# Patient Record
Sex: Female | Born: 1985 | Race: White | Hispanic: No | Marital: Single | Smoking: Current every day smoker
Health system: Southern US, Community
[De-identification: ages and names within clinical notes are randomized; demographics above are authoritative.]

## PROBLEM LIST (undated history)

## (undated) DIAGNOSIS — F32A Depression, unspecified: Secondary | ICD-10-CM

## (undated) DIAGNOSIS — T7840XA Allergy, unspecified, initial encounter: Secondary | ICD-10-CM

## (undated) DIAGNOSIS — D649 Anemia, unspecified: Secondary | ICD-10-CM

## (undated) DIAGNOSIS — J45909 Unspecified asthma, uncomplicated: Secondary | ICD-10-CM

## (undated) DIAGNOSIS — N200 Calculus of kidney: Secondary | ICD-10-CM

## (undated) DIAGNOSIS — N870 Mild cervical dysplasia: Secondary | ICD-10-CM

## (undated) DIAGNOSIS — M797 Fibromyalgia: Secondary | ICD-10-CM

## (undated) DIAGNOSIS — F329 Major depressive disorder, single episode, unspecified: Secondary | ICD-10-CM

## (undated) HISTORY — PX: OTHER SURGICAL HISTORY: SHX169

## (undated) HISTORY — DX: Calculus of kidney: N20.0

## (undated) HISTORY — DX: Allergy, unspecified, initial encounter: T78.40XA

## (undated) HISTORY — PX: WISDOM TOOTH EXTRACTION: SHX21

## (undated) HISTORY — DX: Depression, unspecified: F32.A

## (undated) HISTORY — DX: Unspecified asthma, uncomplicated: J45.909

## (undated) HISTORY — DX: Major depressive disorder, single episode, unspecified: F32.9

## (undated) HISTORY — DX: Anemia, unspecified: D64.9

## (undated) HISTORY — DX: Fibromyalgia: M79.7

## (undated) HISTORY — PX: CYSTOSCOPY: SUR368

## (undated) HISTORY — DX: Mild cervical dysplasia: N87.0

---

## 2012-02-05 ENCOUNTER — Ambulatory Visit (INDEPENDENT_AMBULATORY_CARE_PROVIDER_SITE_OTHER): Payer: BC Managed Care – PPO | Admitting: Family Medicine

## 2012-02-05 DIAGNOSIS — M545 Low back pain, unspecified: Secondary | ICD-10-CM

## 2012-02-05 DIAGNOSIS — R944 Abnormal results of kidney function studies: Secondary | ICD-10-CM

## 2012-02-05 DIAGNOSIS — R109 Unspecified abdominal pain: Secondary | ICD-10-CM

## 2012-02-05 DIAGNOSIS — N23 Unspecified renal colic: Secondary | ICD-10-CM

## 2012-02-05 DIAGNOSIS — R6889 Other general symptoms and signs: Secondary | ICD-10-CM

## 2012-02-05 DIAGNOSIS — N200 Calculus of kidney: Secondary | ICD-10-CM | POA: Insufficient documentation

## 2012-02-05 LAB — POCT URINALYSIS DIPSTICK
Blood, UA: NEGATIVE
Glucose, UA: NEGATIVE
Nitrite, UA: NEGATIVE
Protein, UA: NEGATIVE
Urobilinogen, UA: 0.2

## 2012-02-05 LAB — POCT UA - MICROSCOPIC ONLY
Casts, Ur, LPF, POC: NEGATIVE
Crystals, Ur, HPF, POC: NEGATIVE
Epithelial cells, urine per micros: NEGATIVE
RBC, urine, microscopic: NEGATIVE
Yeast, UA: NEGATIVE

## 2012-02-05 MED ORDER — HYDROCODONE-ACETAMINOPHEN 5-500 MG PO TABS: 1.0000 | ORAL_TABLET | Freq: Three times a day (TID) | ORAL | Status: AC | PRN

## 2012-02-05 MED ORDER — NAPROXEN 500 MG PO TABS: 500.0000 mg | ORAL_TABLET | Freq: Two times a day (BID) | ORAL | Status: DC

## 2012-02-05 NOTE — Progress Notes (Signed)
[facility]:  Office Visit  Chief Complaint:  Chief Complaint  Patient presents with  . Back Pain    low back; h/o of kidney stones; pain started fri night    HPI: Dawn Dunn is a 26 y.o. female who complains of  3 day history of right back pain, nausea. She has a hx of kidney stones ( uric acid) and this feels like it. No UTI sxs. LMP 01/13/2012.  Past Medical History  Diagnosis Date  . Kidney stones   . Anemia   . Fibromyalgia   . Mild cervical dysplasia    Past Surgical History  Procedure Date  . Right ankle fracture    History   Social History  . Marital Status: Single    Spouse Name: N/A    Number of Children: N/A  . Years of Education: N/A   Social History Main Topics  . Smoking status: Current Everyday Smoker -- 1.0 packs/day for 8 years  . Smokeless tobacco: None  . Alcohol Use: No  . Drug Use: No  . Sexually Active: None   Other Topics Concern  . None   Social History Narrative  . None   Family History  Problem Relation Age of Onset  . Diabetes Father   . Depression Father   . Hyperlipidemia Father    Allergies  Allergen Reactions  . Toradol Other (See Comments)    Pain at the injection site  . Lortab Rash    Only allergic to [employer]; Hydrocodone tablets are ok   Prior to Admission medications   Medication Sig Start Date End Date Taking? Authorizing Provider  fluticasone (FLONASE) 50 MCG/ACT nasal spray Place 2 sprays into the nose daily.   Yes Historical Provider, MD     ROS: The patient denies fevers, chills, night sweats, unintentional weight loss, chest pain, palpitations, wheezing, dyspnea on exertion, vomiting, abdominal pain, dysuria, hematuria, melena, numbness, weakness, or tingling.+ back ache  All other systems have been reviewed and were otherwise negative with the exception of those mentioned in the HPI and as above.    PHYSICAL EXAM: Filed Vitals:   02/05/12 1945  BP: 115/71  Pulse: 102    Temp: 98.1 F (36.7 C)  Resp: 16   Filed Vitals:   02/05/12 1945  Height: 5\' 4"  (1.626 m)  Weight: 179 lb (81.194 kg)   Body mass index is 30.73 kg/(m^2).  General: Alert, no acute distress HEENT:  Normocephalic, atraumatic, oropharynx patent. Cardiovascular:  Regular rate and rhythm, no rubs murmurs or gallops.  No Carotid bruits, radial pulse intact. No pedal edema.  Respiratory: Clear to auscultation bilaterally.  No wheezes, rales, or rhonchi.  No cyanosis, no use of accessory musculature GI: No organomegaly, abdomen is soft and non-tender, positive bowel sounds.  No masses. Skin: No rashes. Neurologic: Facial musculature symmetric. Psychiatric: Patient is appropriate throughout our interaction. Lymphatic: No axillary or cervical lymphadenopathy Musculoskeletal: Gait intact.+Right flank tenderness   LABS: Results for orders placed in visit on 02/05/12  POCT URINALYSIS DIPSTICK      Component Value Range   Color, UA yellow     Clarity, UA clear     Glucose, UA neg     Bilirubin, UA neg     Ketones, UA neg     Spec Grav, UA 1.020     Blood, UA neg     pH, UA 6.0     Protein, UA neg     Urobilinogen, UA 0.2  Nitrite, UA neg     Leukocytes, UA Trace    POCT UA - MICROSCOPIC ONLY      Component Value Range   WBC, Ur, HPF, POC 0-3     RBC, urine, microscopic neg     Bacteria, U Microscopic trace     Mucus, UA neg     Epithelial cells, urine per micros neg     Crystals, Ur, HPF, POC neg     Casts, Ur, LPF, POC neg     Yeast, UA neg       ASSESSMENT/PLAN: Encounter Diagnosis  Name Primary?  . Low back pain   1. Right low back pain-most likely renal stone vs UTI. Patient feels like this is similar to her renal colic. Will cx urine before treating. For now will treat as if renal stone.  Rx Vicodin and Naproxen.  If no improvement or pain worsen then call and will get CT scan, refer to urology.     Hamilton Capri PHUONG, DO 02/05/2012 8:05 PM

## 2012-02-07 LAB — URINE CULTURE
Colony Count: NO GROWTH
Organism ID, Bacteria: NO GROWTH

## 2012-10-23 ENCOUNTER — Ambulatory Visit (INDEPENDENT_AMBULATORY_CARE_PROVIDER_SITE_OTHER): Payer: BC Managed Care – PPO | Admitting: Family Medicine

## 2012-10-23 ENCOUNTER — Ambulatory Visit: Payer: BC Managed Care – PPO

## 2012-10-23 VITALS — BP 111/75 | HR 79 | Temp 98.6°F | Resp 16 | Ht 64.5 in | Wt 181.0 lb

## 2012-10-23 DIAGNOSIS — R05 Cough: Secondary | ICD-10-CM

## 2012-10-23 DIAGNOSIS — J40 Bronchitis, not specified as acute or chronic: Secondary | ICD-10-CM

## 2012-10-23 DIAGNOSIS — R062 Wheezing: Secondary | ICD-10-CM

## 2012-10-23 DIAGNOSIS — J9801 Acute bronchospasm: Secondary | ICD-10-CM

## 2012-10-23 LAB — POCT CBC
Granulocyte percent: 61.6 %G (ref 37–80)
HCT, POC: 45 % (ref 37.7–47.9)
Hemoglobin: 13.7 g/dL (ref 12.2–16.2)
MCV: 97.5 fL — AB (ref 80–97)
POC Granulocyte: 7.6 — AB (ref 2–6.9)
POC LYMPH PERCENT: 33.8 %L (ref 10–50)
RBC: 4.62 M/uL (ref 4.04–5.48)

## 2012-10-23 MED ORDER — ALBUTEROL SULFATE (2.5 MG/3ML) 0.083% IN NEBU
2.5000 mg | INHALATION_SOLUTION | Freq: Once | RESPIRATORY_TRACT | Status: AC
  Administered 2012-10-23: 2.5 mg via RESPIRATORY_TRACT

## 2012-10-23 MED ORDER — HYDROCODONE-ACETAMINOPHEN 5-325 MG PO TABS: 1.0000 | ORAL_TABLET | Freq: Four times a day (QID) | ORAL | Status: DC | PRN

## 2012-10-23 MED ORDER — IPRATROPIUM BROMIDE 0.02 % IN SOLN
0.5000 mg | Freq: Once | RESPIRATORY_TRACT | Status: AC
  Administered 2012-10-23: 0.5 mg via RESPIRATORY_TRACT

## 2012-10-23 MED ORDER — AZITHROMYCIN 500 MG PO TABS: 500.0000 mg | ORAL_TABLET | Freq: Every day | ORAL | Status: DC

## 2012-10-23 NOTE — Progress Notes (Signed)
Patient ID: Nanami Whitelaw MRN: [id number], DOB: 16-Jun-1986, 26 y.o. Date of Encounter: 10/23/2012, 6:17 PM  Primary Physician: No primary provider on file.  Chief Complaint:  Chief Complaint  Patient presents with  . URI    x 8 days    HPI: 26 y.o. year old female presents with an eight day history of nasal congestion, post nasal drip, sore throat, cough, and wheezing. No sinus pressure. Afebrile. No chills. Nasal congestion thick and green/yellow. Cough is productive of green/yellow sputum and worse in the morning. Normal hearing. Has been using her albuterol inhaler 2-3 times per day throughout illness to help with her wheezing and shortness of breath. Asthma typically well controlled at baseline, not requiring the use of any medications. Only needs albuterol when she is ill. No GI complaints. Appetite normal. She is concerned because her sister is having a baby on 10/28/12 and she wants to be able to be around the family at this time. She is up to date with her TDaP. Current everyday smoker.   No sick contacts, recent antibiotics, or recent travels.   No leg trauma, sedentary periods, or h/o cancer.  Past Medical History  Diagnosis Date  . Kidney stones   . Anemia   . Fibromyalgia   . Mild cervical dysplasia   . Asthma      Home Meds: Prior to Admission medications   Not on File    Allergies:  Allergies  Allergen Reactions  . Ketorolac Tromethamine Other (See Comments)    Pain at the injection site  . Hydrocodone-Acetaminophen Rash    Only allergic to [employer]; Hydrocodone tablets are ok    History   Social History  . Marital Status: Single    Spouse Name: N/A    Number of Children: N/A  . Years of Education: N/A   Occupational History  . Not on file.   Social History Main Topics  . Smoking status: Current Every Day Smoker -- 1.0 packs/day for 8 years  . Smokeless tobacco: Not on file  . Alcohol Use: No  . Drug Use: No  . Sexually Active: Not on  file   Other Topics Concern  . Not on file   Social History Narrative  . No narrative on file     Review of Systems: Constitutional: negative for chills, fever, night sweats or weight changes Cardiovascular: negative for chest pain or palpitations Respiratory: negative for hemoptysis or dyspnea  Abdominal: negative for abdominal pain, nausea, vomiting or diarrhea Dermatological: negative for rash Neurologic: negative for headache   Physical Exam: Blood pressure 111/75, pulse 79, temperature 98.6 F (37 C), resp. rate 16, height 5' 4.5" (1.638 m), weight 181 lb (82.101 kg), last menstrual period 10/15/2012, SpO2 97.00%., Body mass index is 30.59 kg/(m^2). General: Well developed, well nourished, in no acute distress. Head: Normocephalic, atraumatic, eyes without discharge, sclera non-icteric, nares are congested. Bilateral auditory canals clear, TM's are without perforation, pearly grey with reflective cone of light bilaterally. No sinus TTP. Oral cavity moist, dentition normal. Posterior pharynx with post nasal drip and mild erythema. No peritonsillar abscess or tonsillar exudate. Neck: Supple. No thyromegaly. Full ROM. No lymphadenopathy. Lungs: Coarse breath sounds bilaterally with bilateral wheezes. No rales or rhonchi. Breathing is unlabored. Status post albuterol and atrovent neb: patient feel better. Decreased wheezing to auscultation.  Heart: RRR with S1 S2. No murmurs, rubs, or gallops appreciated. Msk:  Strength and tone normal for age. Extremities: No clubbing or cyanosis. No edema. Neuro:  Alert and oriented X 3. Moves all extremities spontaneously. CNII-XII grossly in tact. Psych:  Responds to questions appropriately with a normal affect.   Labs: Results for orders placed in visit on 10/23/12  POCT CBC      Component Value Range   WBC 12.3 (*) 4.6 - 10.2 K/uL   Lymph, poc 4.2 (*) 0.6 - 3.4   POC LYMPH PERCENT 33.8  10 - 50 %L   MID (cbc) 0.6  0 - 0.9   POC MID % 4.6   0 - 12 %M   POC Granulocyte 7.6 (*) 2 - 6.9   Granulocyte percent 61.6  37 - 80 %G   RBC 4.62  4.04 - 5.48 M/uL   Hemoglobin 13.7  12.2 - 16.2 g/dL   HCT, POC 13.0  86.5 - 47.9 %   MCV 97.5 (*) 80 - 97 fL   MCH, POC 29.7  27 - 31.2 pg   MCHC 30.4 (*) 31.8 - 35.4 g/dL   RDW, POC 78.4     Platelet Count, POC 366  142 - 424 K/uL   MPV 10.6  0 - 99.8 fL   [facility] reading (PRIMARY) by  Dr. Milus Glazier. Enlarged perihilar lymph node. No infiltrate.   ASSESSMENT AND PLAN:  26 y.o. year old female with bronchitis, bronchospasm, cough, wheezing, and tobacco use.  -Azithromycin 500 mg 1 po daily #5 no RF -Norco 5/325 mg 1 po q 4-6 hours prn pain #30 no RF, SED, she tolerates pill form of this -Use albuterol inhaler, does not need a refill -At discharge patient states her breathing is much better  -Mucinex -Tylenol/Motrin prn -Rest/fluids -Recommend repeat CXR 6 months, my card was given to patient. She was advised to call in AM and schedule appointment for this follow up study. Discussed with patient.  -Lengthy discussion with patient about stopping smoking, she states that she is not ready to stop as she enjoys this. I have asked her to call me in one month with her progress on quitting tobacco use.  -RTC precautions -RTC 3-5 days if no improvement  Signed, Eula Listen, PA-C 10/23/2012 6:17 PM

## 2012-11-01 ENCOUNTER — Telehealth: Payer: Self-pay

## 2012-11-01 NOTE — Telephone Encounter (Signed)
Was here for bronchitis, 10/23/12 now wants to try to quit smoking. Recommend repeat CXR 6 months, my card was given to patient. She was advised to call in AM and schedule appointment for this follow up study. Discussed with patient.  -Lengthy discussion with patient about stopping smoking, she states that she is not ready to stop as she enjoys this. I have asked her to call me in one month with her progress on quitting tobacco use.    Please advise

## 2012-11-01 NOTE — Telephone Encounter (Signed)
Pt would like to talk to [facility] about quitting smoking--needs advise  [phone]

## 2012-11-06 NOTE — Telephone Encounter (Signed)
Ryan, I'm not sure if you ever saw this message.

## 2012-11-06 NOTE — Telephone Encounter (Signed)
Encounter still open.

## 2012-11-07 MED ORDER — VARENICLINE TARTRATE 0.5 MG X 11 & 1 MG X 42 PO MISC: ORAL | Status: DC

## 2012-11-07 NOTE — Telephone Encounter (Signed)
Patient called to state that she is feeling guilty that her sister's new baby is having issues with their lungs having to be in the ICU at [facility] with chest tubes and O2. Patient would like to quit smoking now because of this. She states she still really enjoys smoking, but wants to quit. She does have a history of depression, but this is well controlled currently. She does state that she has a good support system at home right now to help her through this from both a smoking stand point and a depression stand point should it worsen. She has tried electronic cigarettes, nicotine gum and patches all without success. They all just make her want another cigarette. She already knows about the association with Chantix or Zyban and depression. She is however very open to initiating therapy with one of these medications today with close follow up. She knows depression warning signs and agrees to let a family know should she become depressed or call 911. She denies any HI or SI. She will follow up with me in one week, sooner if worse.

## 2012-11-14 ENCOUNTER — Telehealth: Payer: Self-pay

## 2012-11-14 NOTE — Telephone Encounter (Signed)
Spoke with patient. She is stressed that her sister's newborn is coming home on 11/15/12 and she is still smoking. She is down to 3-4 cigarettes per day. Her goal is to not smoke at all when she gets home from work today. Although she is scared because she still really enjoys it. She states that she is also stressed because her father kept her up the previous night by having her take him to the ER "for attention," and she yelled at him. Now she feels bad about that. She will try to find different coping mechanisms rather than smoking when she is stressed. She is to call back in one week with an update, sooner if she is worse. I have advised her that she should be proud of her accomplishments so far.

## 2012-11-14 NOTE — Telephone Encounter (Signed)
PATIENT CAME BY TO SPEAK TO RYAN DUNN ON HER BREAK BUT HE WAS IN A PATIENTS ROOM. PATIENT HAD TO LEAVE QUICKLY, BUT WILL TRY TO COME TOMORROW DURING HER LUNCH BREAK. SHE JUST WANTED TO LET HIM KNOW SHE CAME BY.

## 2012-11-28 ENCOUNTER — Telehealth: Payer: Self-pay

## 2012-11-28 NOTE — Telephone Encounter (Signed)
PT STATES SHE WAS SEEN BY RYAN AND HE WANTED HER TO TOUCH BASE WITH HIM. PLEASE CALL [phone] I DON'T SEE WHERE SHE HAD SEEN RYAN, BUT SHE DID SAY HIM

## 2012-11-29 NOTE — Telephone Encounter (Signed)
Noted. Please call the patient back to tell them I will give them a call sometime this weekend if it slows down, otherwise I will call first part of the week. Kudos on quitting smoking.

## 2012-11-29 NOTE — Telephone Encounter (Signed)
Dawn Dunn did see her on 10/23/12. Patient states she has quit smoking, and was calling to speak to Dawn Dunn about it, she states she is well, she was just calling to talk to [name] about it.

## 2012-11-30 NOTE — Telephone Encounter (Signed)
Called pt LMOM wit info from [place].

## 2012-12-20 ENCOUNTER — Encounter: Payer: Self-pay | Admitting: Family Medicine

## 2012-12-20 ENCOUNTER — Ambulatory Visit (INDEPENDENT_AMBULATORY_CARE_PROVIDER_SITE_OTHER): Payer: BC Managed Care – PPO | Admitting: Family Medicine

## 2012-12-20 VITALS — BP 112/74 | HR 78 | Temp 98.5°F | Resp 16 | Ht 64.0 in | Wt 188.0 lb

## 2012-12-20 DIAGNOSIS — Z0000 Encounter for general adult medical examination without abnormal findings: Secondary | ICD-10-CM

## 2012-12-20 DIAGNOSIS — Z23 Encounter for immunization: Secondary | ICD-10-CM

## 2012-12-20 LAB — CBC WITH DIFFERENTIAL/PLATELET
Basophils Absolute: 0 10*3/uL (ref 0.0–0.1)
HCT: 38.6 % (ref 36.0–46.0)
Hemoglobin: 12.9 g/dL (ref 12.0–15.0)
Lymphocytes Relative: 30 % (ref 12–46)
Monocytes Absolute: 0.4 10*3/uL (ref 0.1–1.0)
Monocytes Relative: 5 % (ref 3–12)
Neutro Abs: 4.8 10*3/uL (ref 1.7–7.7)
RDW: 12.7 % (ref 11.5–15.5)
WBC: 7.7 10*3/uL (ref 4.0–10.5)

## 2012-12-20 LAB — COMPREHENSIVE METABOLIC PANEL
AST: 15 U/L (ref 0–37)
Albumin: 4.4 g/dL (ref 3.5–5.2)
BUN: 10 mg/dL (ref 6–23)
Calcium: 9.1 mg/dL (ref 8.4–10.5)
Chloride: 105 mEq/L (ref 96–112)
Potassium: 4.4 mEq/L (ref 3.5–5.3)
Sodium: 139 mEq/L (ref 135–145)
Total Protein: 6.9 g/dL (ref 6.0–8.3)

## 2012-12-20 LAB — POCT UA - MICROSCOPIC ONLY

## 2012-12-20 LAB — POCT URINALYSIS DIPSTICK
Bilirubin, UA: NEGATIVE
Ketones, UA: NEGATIVE
Leukocytes, UA: NEGATIVE

## 2012-12-20 LAB — LIPID PANEL: HDL: 51 mg/dL (ref >39)

## 2012-12-20 LAB — TSH: TSH: 3.088 u[IU]/mL (ref 0.350–4.500)

## 2012-12-20 NOTE — Progress Notes (Addendum)
Subjective:    Patient ID: Dawn Dunn, female    DOB: [birth year], 26 y.o.   MRN: [id number] Chief Complaint  Patient presents with  . Annual Exam    HPI  Had abnml pap smear 2005 w/ several abnml colposcopies so had cryo in 2005 since then all have been normal. Has had yearly nml pap smears since then.   Has a h/o chronic back pain for over 10 yrs - told in the past that she had fibromyalgia from this.  Was told that her kidney stones were uric acid just by a physician looking at them but doesn't think that they have been sent off for lab analysis so concerned about taking calcium supplements. Not on mvi.  No current prob w/ kidney stones Stopped smoking!    Past Medical History  Diagnosis Date  . Kidney stones   . Anemia   . Fibromyalgia   . Mild cervical dysplasia   . Asthma    Past Surgical History  Procedure Date  . Right ankle fracture   . Wisdom tooth extraction   . Cystoscopy   . Cervical cyrosurgery    Family History  Problem Relation Age of Onset  . Diabetes Father   . Depression Father   . Hyperlipidemia Father    History   Social History  . Marital Status: Single    Spouse Name: N/A    Number of Children: N/A  . Years of Education: N/A   Occupational History  . Not on file.   Social History Main Topics  . Smoking status: Former Smoker -- 1.0 packs/day for 8 years    Quit date: 11/25/2012  . Smokeless tobacco: Never Used  . Alcohol Use: No  . Drug Use: No  . Sexually Active: Not on file   Other Topics Concern  . Not on file   Social History Narrative  . No narrative on file  Works at the [employer] so has had the rabies vaccine.  No current outpatient prescriptions on file prior to visit.   Allergies  Allergen Reactions  . Ketorolac Tromethamine Other (See Comments)    Pain at the injection site  . Hydrocodone-Acetaminophen Rash    Only allergic to [employer]; Hydrocodone tablets are ok      Review of Systems    Constitutional: Negative for fever, chills, diaphoresis, activity change, appetite change, fatigue and unexpected weight change.  HENT: Negative for hearing loss, ear pain, nosebleeds, congestion, sore throat, facial swelling, rhinorrhea, sneezing, drooling, mouth sores, trouble swallowing, neck pain, neck stiffness, dental problem, voice change, postnasal drip, sinus pressure, tinnitus and ear discharge.   Eyes: Negative for photophobia, pain, discharge, redness, itching and visual disturbance.  Respiratory: Negative for apnea, cough, choking, chest tightness, shortness of breath, wheezing and stridor.   Cardiovascular: Negative for chest pain, palpitations and leg swelling.  Gastrointestinal: Negative for nausea, vomiting, abdominal pain, diarrhea, constipation, blood in stool, abdominal distention, anal bleeding and rectal pain.  Genitourinary: Negative for dysuria, urgency, frequency, hematuria, flank pain, decreased urine volume, vaginal bleeding, vaginal discharge, enuresis, difficulty urinating, genital sores, vaginal pain, menstrual problem, pelvic pain and dyspareunia.  Musculoskeletal: Positive for myalgias and back pain. Negative for joint swelling, arthralgias and gait problem.  Skin: Negative for color change, pallor, rash and wound.  Neurological: Negative for dizziness, tremors, seizures, syncope, facial asymmetry, speech difficulty, weakness, light-headedness, numbness and headaches.  Hematological: Negative for adenopathy. Does not bruise/bleed easily.  Psychiatric/Behavioral: Negative for suicidal ideas, hallucinations, behavioral problems, confusion,  sleep disturbance, self-injury, dysphoric mood, decreased concentration and agitation. The patient is not nervous/anxious and is not hyperactive.       BP 112/74  Pulse 78  Temp 98.5 F (36.9 C) (Oral)  Resp 16  Ht 5\' 4"  (1.626 m)  Wt 188 lb (85.276 kg)  BMI 32.27 kg/m2  SpO2 100%  LMP 12/07/2012 Objective:   Physical Exam   Constitutional: She is oriented to person, place, and time. She appears well-developed and well-nourished. No distress.  HENT:  Head: Normocephalic and atraumatic.  Right Ear: Tympanic membrane, external ear and ear canal normal.  Left Ear: Tympanic membrane, external ear and ear canal normal.  Nose: Nose normal. No rhinorrhea.  Mouth/Throat: Uvula is midline, oropharynx is clear and moist and mucous membranes are normal. No posterior oropharyngeal erythema.  Eyes: Conjunctivae normal and EOM are normal. Pupils are equal, round, and reactive to light. Right eye exhibits no discharge. Left eye exhibits no discharge. No scleral icterus.  Neck: Normal range of motion. Neck supple. No thyromegaly present.  Cardiovascular: Normal rate, regular rhythm, normal heart sounds and intact distal pulses.   Pulmonary/Chest: Effort normal and breath sounds normal. No respiratory distress.  Abdominal: Soft. Bowel sounds are normal. There is no tenderness.  Genitourinary: Vagina normal and uterus normal. There is no rash or lesion on the right labia. There is no rash or lesion on the left labia. Cervix exhibits no motion tenderness and no friability. Right adnexum displays no mass and no tenderness. Left adnexum displays no mass and no tenderness.  Musculoskeletal: She exhibits no edema.  Lymphadenopathy:    She has no cervical adenopathy.  Neurological: She is alert and oriented to person, place, and time. She has normal reflexes.  Skin: Skin is warm and dry. She is not diaphoretic. No erythema.  Psychiatric: She has a normal mood and affect. Her behavior is normal.      Results for orders placed in visit on 12/20/12  POCT UA - MICROSCOPIC ONLY      Component Value Range   WBC, Ur, HPF, POC 0-2     RBC, urine, microscopic 0-3     Bacteria, U Microscopic TRACE     Mucus, UA SMALL     Epithelial cells, urine per micros 0-8     Crystals, Ur, HPF, POC NEG     Casts, Ur, LPF, POC NEG     Yeast, UA NEG     POCT URINALYSIS DIPSTICK      Component Value Range   Color, UA yellow     Clarity, UA clear     Glucose, UA neg     Bilirubin, UA neg     Ketones, UA neg     Spec Grav, UA 1.020     Blood, UA traCE     pH, UA 7.0     Protein, UA NEG     Urobilinogen, UA 0.2     Nitrite, UA NEG     Leukocytes, UA Negative      Assessment & Plan:   1. Annual physical exam  POCT UA - Microscopic Only, POCT urinalysis dipstick, CBC with Differential, Comprehensive metabolic panel, Lipid panel, TSH, Pap IG, CT/NG w/ reflex HPV when ASC-U, Vitamin D 25 hydroxy. If pap is negative, repeat in 3 yrs.  Ok to proceed w/ routine pap screening despite + history as abnormal pap and treatment was prior to 26 yo and all since 26 yo have been nml.  2. Need for prophylactic vaccination  and inoculation against influenza  Flu vaccine greater than or equal to 3yo preservative free IM  3. Back pain - pt would like some work limitations so that she stops exacerbating her back pain.  I advised her that she would need a full appt for complete eval of back pain before we could discuss this and she plans to sched. 4. Family planning - NEED TO DISCUSS AT F/U!!!!!!!!!

## 2012-12-21 ENCOUNTER — Encounter: Payer: Self-pay | Admitting: Family Medicine

## 2012-12-23 LAB — PAP IG, CT-NG, RFX HPV ASCU
Chlamydia Probe Amp: NEGATIVE
GC Probe Amp: NEGATIVE

## 2012-12-24 ENCOUNTER — Other Ambulatory Visit: Payer: Self-pay | Admitting: Family Medicine

## 2012-12-24 DIAGNOSIS — Z3009 Encounter for other general counseling and advice on contraception: Secondary | ICD-10-CM

## 2012-12-24 MED ORDER — NORGESTIM-ETH ESTRAD TRIPHASIC 0.18/0.215/0.25 MG-35 MCG PO TABS: 1.0000 | ORAL_TABLET | Freq: Every day | ORAL | Status: DC

## 2012-12-25 ENCOUNTER — Encounter: Payer: Self-pay | Admitting: Family Medicine

## 2013-01-17 ENCOUNTER — Ambulatory Visit: Payer: BC Managed Care – PPO

## 2013-01-17 ENCOUNTER — Ambulatory Visit (INDEPENDENT_AMBULATORY_CARE_PROVIDER_SITE_OTHER): Payer: BC Managed Care – PPO | Admitting: Family Medicine

## 2013-01-17 VITALS — BP 113/69 | HR 78 | Temp 98.4°F | Resp 16 | Ht 64.5 in | Wt 194.0 lb

## 2013-01-17 DIAGNOSIS — M546 Pain in thoracic spine: Secondary | ICD-10-CM

## 2013-01-17 DIAGNOSIS — M542 Cervicalgia: Secondary | ICD-10-CM

## 2013-01-17 DIAGNOSIS — M797 Fibromyalgia: Secondary | ICD-10-CM

## 2013-01-17 DIAGNOSIS — IMO0001 Reserved for inherently not codable concepts without codable children: Secondary | ICD-10-CM

## 2013-01-17 MED ORDER — AMITRIPTYLINE HCL 50 MG PO TABS: 25.0000 mg | ORAL_TABLET | Freq: Every day | ORAL | Status: DC

## 2013-01-17 MED ORDER — CYCLOBENZAPRINE HCL 10 MG PO TABS: 10.0000 mg | ORAL_TABLET | Freq: Three times a day (TID) | ORAL | Status: DC | PRN

## 2013-01-17 NOTE — Patient Instructions (Addendum)
   Fibromyalgia Fibromyalgia is a disorder that is often misunderstood. It is associated with muscular pains and tenderness that comes and goes. It is often associated with fatigue and sleep disturbances. Though it tends to be long-lasting, fibromyalgia is not life-threatening. CAUSES  The exact cause of fibromyalgia is unknown. People with certain gene types are predisposed to developing fibromyalgia and other conditions. Certain factors can play a role as triggers, such as:  Spine disorders.  Arthritis.  Severe injury (trauma) and other physical stressors.  Emotional stressors. SYMPTOMS   The main symptom is pain and stiffness in the muscles and joints, which can vary over time.  Sleep and fatigue problems. Other related symptoms may include:  Bowel and bladder problems.  Headaches.  Visual problems.  Problems with odors and noises.  Depression or mood changes.  Painful periods (dysmenorrhea).  Dryness of the skin or eyes. DIAGNOSIS  There are no specific tests for diagnosing fibromyalgia. Patients can be diagnosed accurately from the specific symptoms they have. The diagnosis is made by determining that nothing else is causing the problems. TREATMENT  There is no cure. Management includes medicines and an active, healthy lifestyle. The goal is to enhance physical fitness, decrease pain, and improve sleep. HOME CARE INSTRUCTIONS   Only take over-the-counter or prescription medicines as directed by your caregiver. Sleeping pills, tranquilizers, and pain medicines may make your problems worse.  Low-impact aerobic exercise is very important and advised for treatment. At first, it may seem to make pain worse. Gradually increasing your tolerance will overcome this feeling.  Learning relaxation techniques and how to control stress will help you. Biofeedback, visual imagery, hypnosis, muscle relaxation, yoga, and meditation are all options.  Anti-inflammatory medicines and  physical therapy may provide short-term help.  Acupuncture or massage treatments may help.  Take muscle relaxant medicines as suggested by your caregiver.  Avoid stressful situations.  Plan a healthy lifestyle. This includes your diet, sleep, rest, exercise, and friends.  Find and practice a hobby you enjoy.  Join a fibromyalgia support group for interaction, ideas, and sharing advice. This may be helpful. SEEK MEDICAL CARE IF:  You are not having good results or improvement from your treatment. FOR MORE INFORMATION  National Fibromyalgia Association: www.fmaware.org Arthritis Foundation: www.arthritis.org Document Released: 12/11/2005 Document Revised: 03/04/2012 Document Reviewed: 03/23/2010 [facility] Patient Information 2013 [place], [place].  Chronic Fatigue Syndrome Chronic Fatigue Syndrome is characterized by extreme fatigue that does not improve with rest. The cause of this condition is unknown.  SYMPTOMS  An unexplained dramatic loss of energy.  Muscle or joint soreness.  Severe weakness.  Frequent headaches.  Fever, sore throat, and swollen lymph glands.  Sleep problems.  Inability to concentrate. Symptoms must usually be present for over 6 months before the diagnosis of chronic fatigue can be made. There is no diagnostic test for this disease. Many other diseases can cause similar symptoms. A complete medical evaluation is needed to be sure you do not have other medical problems causing your symptoms. There is no specific treatment for Chronic Fatigue Syndrome. Cognitive behavioral therapy (similar to counseling) and/or a simple exercise regimen may be beneficial. Get plenty of rest and avoid alcohol and other depressant drugs. Call your caregiver for follow up care as recommended. Document Released: 01/18/2005 Document Revised: 03/04/2012 Document Reviewed: 03/12/2009 [facility] Patient Information 2013 [place], [place].

## 2013-01-17 NOTE — Progress Notes (Signed)
Subjective:    Patient ID: Dawn Dunn, female    DOB: 09-09-1986, 27 y.o.   MRN: [id number] Chief Complaint  Patient presents with  . chronic back pain    12 yrs    HPI  Dawn Dunn is a pleasant 27 yo woman who has had back pain for over 12 yrs.  Nothing happened at onset of pain to trigger but was diagnosed w/ depression the same yr. Had a lot of testing in [place] when she was a teen w/ blood work, Veterinary surgeon - perhaps MRI - this was about 8 yrs ago. Did PT when 27 yo.  She was referred to a pain specialist who and told she had fibromyalgia. Has been to several chiropractors regularly over the years, also tried acupunture - all helped briefly - for a few days and then back pain returned to baseline.  Has tried cymbalta 90mg  for depression but to expensive - helped pain a little but no other medications have helped at all although she has tried other pain medications.  Mood is mediocre now - has not been on mood med x 3 yrs as she lost her insurance. She is not sure if she has FM - the other day she moved her neck wrong and pulled a muscle and when she moves the wrong way, just folding laundry or standing for several hours will cause pain for days. Has told she has had some pinched nerves Has had a lot of CT scans for kidney stones. Seems like things are getting worse.   Not sleeping well at night - can't get comfortable - feels spastic and has to turn certain ways.  Pain is only in neck and shoulder and back - no pain in chest, arms, or legs Did break left foot and right ankle so painful in winter. Hands and elbows swell and get stiff but better w/ icy hot.  Celexa, PAXIL, effexor, zoloft, lexapro, wellbutrin, prozac - did not work for her. Most she was on from [age] yo.  Past Medical History  Diagnosis Date  . Kidney stones   . Anemia   . Fibromyalgia   . Mild cervical dysplasia   . Asthma    Current Outpatient Prescriptions on File Prior to Visit  Medication Sig Dispense Refill  .  Norgestimate-Ethinyl Estradiol Triphasic (TRI-SPRINTEC) 0.18/0.215/0.25 MG-35 MCG tablet Take 1 tablet by mouth daily.  1 Package  11  . amitriptyline (ELAVIL) 50 MG tablet Take 0.5 tablets (25 mg total) by mouth at bedtime.  30 tablet  1  . CHANTIX STARTING MONTH PAK 0.5 MG X 11 & 1 MG X 42 tablet        Allergies  Allergen Reactions  . Ketorolac Tromethamine Other (See Comments)    Pain at the injection site  . Hydrocodone-Acetaminophen Rash    Only allergic to [employer]; Hydrocodone tablets are ok    Review of Systems  Constitutional: Positive for activity change and fatigue. Negative for fever, chills and unexpected weight change.  Gastrointestinal: Negative for nausea, vomiting, abdominal pain, diarrhea and constipation.  Genitourinary: Negative for urgency, frequency, flank pain, decreased urine volume and difficulty urinating.  Musculoskeletal: Positive for myalgias, back pain, joint swelling and arthralgias. Negative for gait problem.  Neurological: Positive for weakness and headaches. Negative for syncope and numbness.  Hematological: Negative for adenopathy. Does not bruise/bleed easily.  Psychiatric/Behavioral: Positive for sleep disturbance, dysphoric mood and decreased concentration. Negative for suicidal ideas, hallucinations, confusion and self-injury. The patient is nervous/anxious.  BP 113/69  Pulse 78  Temp 98.4 F (36.9 C)  Resp 16  Ht 5' 4.5" (1.638 m)  Wt 194 lb (87.998 kg)  BMI 32.79 kg/m2  LMP 12/07/2012 Objective:   Physical Exam  Constitutional: She is oriented to person, place, and time. She appears well-developed and well-nourished.  HENT:  Head: Normocephalic.  Eyes: Conjunctivae normal are normal. No scleral icterus.  Neck: Normal range of motion. Neck supple.  Cardiovascular: Normal rate, regular rhythm and normal heart sounds.   Pulmonary/Chest: Effort normal and breath sounds normal. No respiratory distress.  Genitourinary: No breast  swelling, tenderness, discharge or bleeding.  Musculoskeletal: Normal range of motion. She exhibits no edema.       Right hip: She exhibits tenderness. She exhibits normal range of motion, normal strength and no bony tenderness.       Left hip: She exhibits tenderness. She exhibits normal range of motion, normal strength and no bony tenderness.       Cervical back: She exhibits tenderness, bony tenderness, pain and spasm. She exhibits normal range of motion, no swelling, no deformity and normal pulse.       Thoracic back: She exhibits tenderness, pain and spasm. She exhibits normal range of motion, no bony tenderness, no swelling and no deformity.       Lumbar back: She exhibits tenderness, pain and spasm. She exhibits normal range of motion, no bony tenderness and no deformity.       Tenderness over bilateral SI joints and diffusely over back No sig pain in all other "FM points" not located on her back.  Neurological: She is alert and oriented to person, place, and time. She has normal strength and normal reflexes. No sensory deficit. She exhibits normal muscle tone. Coordination and gait normal.  Reflex Scores:      Patellar reflexes are 2+ on the right side and 2+ on the left side.      Achilles reflexes are 2+ on the right side and 2+ on the left side. Skin: Skin is warm and dry. No erythema.  Psychiatric: She has a normal mood and affect. Her behavior is normal.      [facility] reading (PRIMARY) by  Dr. Clelia Croft.   cervical spine - no acute abnormality Thoracic spine - no acute abnormality Assessment & Plan:  1. Chronic back pain -  pt has many of the symptoms of fibromyalgia such has mood, fatigue, and sleep problems, as well as no objectively identifiable abnormality readily apparent on exam or imaging. However, it is unusual that FM pain would be isolated to her back only.  Gave pt handouts for reading to see if she feels that she identifies. Will start trial of treatment for FM with elavil and  increase as tolerated as well as trial of muscle relaxant. If she does not respond to these, perhaps FM diagnosis is less likely. I spent a very long time emphasizing the importance of exercise even though it is veyr painful and seems to make things immed worse, in the long run it is the only thing that will actually help improve FM pain so she has to push through. Check esr/crp, ck and if elevated, consider doing additional rheumatologic/arthritis labs.  Agree w/ restarting cymbalta in the future as should be generic and so affordable very soon.  1. Fibromyalgia  amitriptyline (ELAVIL) 50 MG tablet, cyclobenzaprine (FLEXERIL) 10 MG tablet, DG Cervical Spine 2 or 3 views, DG Thoracic Spine 2 View  2. Neck pain  DG Cervical  Spine 2 or 3 views, DG Thoracic Spine 2 View  3. Thoracic spine pain  Sedimentation Rate, C-reactive protein, CK, DG Cervical Spine 2 or 3 views, DG Thoracic Spine 2 View   Meds ordered this encounter  Medications         . amitriptyline (ELAVIL) 50 MG tablet    Sig: Take 0.5 tablets (25 mg total) by mouth at bedtime.    Dispense:  30 tablet    Refill:  1  . cyclobenzaprine (FLEXERIL) 10 MG tablet    Sig: Take 1 tablet (10 mg total) by mouth 3 (three) times daily as needed for muscle spasms.    Dispense:  60 tablet    Refill:  0

## 2013-01-18 LAB — SEDIMENTATION RATE: Sed Rate: 40 mm/hr — ABNORMAL HIGH (ref 0–22)

## 2013-01-26 ENCOUNTER — Encounter: Payer: Self-pay | Admitting: Family Medicine

## 2013-02-11 ENCOUNTER — Ambulatory Visit (INDEPENDENT_AMBULATORY_CARE_PROVIDER_SITE_OTHER): Payer: BC Managed Care – PPO | Admitting: Family Medicine

## 2013-02-11 VITALS — BP 116/70 | HR 106 | Temp 98.3°F | Resp 18 | Ht 64.0 in | Wt 197.0 lb

## 2013-02-11 DIAGNOSIS — M659 Synovitis and tenosynovitis, unspecified: Secondary | ICD-10-CM

## 2013-02-11 DIAGNOSIS — M65949 Unspecified synovitis and tenosynovitis, unspecified hand: Secondary | ICD-10-CM | POA: Insufficient documentation

## 2013-02-11 DIAGNOSIS — M65839 Other synovitis and tenosynovitis, unspecified forearm: Secondary | ICD-10-CM

## 2013-02-11 MED ORDER — MELOXICAM 15 MG PO TABS: 15.0000 mg | ORAL_TABLET | Freq: Every day | ORAL | Status: DC

## 2013-02-11 NOTE — Patient Instructions (Addendum)
Thank you for coming in today. We will get a bunch of labs today. We will try meloxicam for pain daily Come back next Tuesday nights to discuss labs and further plan.

## 2013-02-11 NOTE — Progress Notes (Addendum)
Dawn Dunn is a 27 y.o. female who presents to [facility] today for right hand swelling starting last week. No injury or overuse. Patient has swelling with mild to moderate pain of her right MCPs. She feels well otherwise with no fevers or chills flulike illness radiating pain weakness or numbness or rash. No history of hand injury.   PMH: Reviewed significant for chronic back pain History  Substance Use Topics  . Smoking status: Former Smoker -- 1.00 packs/day for 8 years    Quit date: 11/25/2012  . Smokeless tobacco: Never Used  . Alcohol Use: No   ROS as above  Medications reviewed. Current Outpatient Prescriptions  Medication Sig Dispense Refill  . amitriptyline (ELAVIL) 50 MG tablet Take 0.5 tablets (25 mg total) by mouth at bedtime.  30 tablet  1  . cholecalciferol (VITAMIN D) 1000 UNITS tablet Take 1,000 Units by mouth daily.      . cyclobenzaprine (FLEXERIL) 10 MG tablet Take 1 tablet (10 mg total) by mouth 3 (three) times daily as needed for muscle spasms.  60 tablet  0  . Norgestimate-Ethinyl Estradiol Triphasic (TRI-SPRINTEC) 0.18/0.215/0.25 MG-35 MCG tablet Take 1 tablet by mouth daily.  1 Package  11   No current facility-administered medications for this visit.    Exam:  BP 116/70  Pulse 106  Temp(Src) 98.3 F (36.8 C)  Resp 18  Ht 5\' 4"  (1.626 m)  Wt 197 lb (89.359 kg)  BMI 33.8 kg/m2  SpO2 97%  LMP 01/28/2013 Gen: Well NAD RIGHT HAND: Moderate synovitis of the diffuse MCPs. Normal pulses capillary refill and sensation Normal motion otherwise.  No results found for this or any previous visit (from the past 72 hour(s)).  Assessment and Plan: 27 y.o. female with synovitis of the right hand MCP joints.  Plan: Unsure ultimate diagnosis. Concern for rheumatologic issue. Plan to assess rheumatoid factor, ANA, double-stranded DNA, CRP, ESR. Treat empirically with meloxicam followup next week.   02/12/13 Reviewed and agree with above plan. Tle

## 2013-02-13 LAB — ANA: Anti Nuclear Antibody(ANA): NEGATIVE

## 2013-02-21 ENCOUNTER — Ambulatory Visit (INDEPENDENT_AMBULATORY_CARE_PROVIDER_SITE_OTHER): Payer: BC Managed Care – PPO | Admitting: Family Medicine

## 2013-02-21 ENCOUNTER — Encounter: Payer: Self-pay | Admitting: Family Medicine

## 2013-02-21 VITALS — BP 108/76 | HR 92 | Temp 98.1°F | Resp 16 | Ht 64.0 in | Wt 197.6 lb

## 2013-02-21 DIAGNOSIS — M129 Arthropathy, unspecified: Secondary | ICD-10-CM

## 2013-02-21 DIAGNOSIS — M545 Low back pain: Secondary | ICD-10-CM

## 2013-02-21 DIAGNOSIS — M549 Dorsalgia, unspecified: Secondary | ICD-10-CM

## 2013-02-21 DIAGNOSIS — G8929 Other chronic pain: Secondary | ICD-10-CM

## 2013-02-21 DIAGNOSIS — M1990 Unspecified osteoarthritis, unspecified site: Secondary | ICD-10-CM

## 2013-02-21 DIAGNOSIS — M797 Fibromyalgia: Secondary | ICD-10-CM

## 2013-02-21 MED ORDER — AMITRIPTYLINE HCL 100 MG PO TABS: 100.0000 mg | ORAL_TABLET | Freq: Every day | ORAL | Status: DC

## 2013-02-21 MED ORDER — MELOXICAM 15 MG PO TABS: 15.0000 mg | ORAL_TABLET | Freq: Every day | ORAL | Status: DC | PRN

## 2013-02-21 MED ORDER — CYCLOBENZAPRINE HCL 10 MG PO TABS: 10.0000 mg | ORAL_TABLET | Freq: Three times a day (TID) | ORAL | Status: DC | PRN

## 2013-02-22 LAB — SEDIMENTATION RATE: Sed Rate: 45 mm/hr — ABNORMAL HIGH (ref 0–22)

## 2013-03-04 ENCOUNTER — Encounter: Payer: Self-pay | Admitting: Family Medicine

## 2013-03-08 ENCOUNTER — Other Ambulatory Visit: Payer: Self-pay | Admitting: Family Medicine

## 2013-03-08 DIAGNOSIS — K219 Gastro-esophageal reflux disease without esophagitis: Secondary | ICD-10-CM

## 2013-03-08 MED ORDER — RANITIDINE HCL 150 MG PO TABS: 150.0000 mg | ORAL_TABLET | Freq: Two times a day (BID) | ORAL | Status: DC

## 2013-03-14 MED ORDER — FLUTICASONE PROPIONATE 50 MCG/ACT NA SUSP: 2.0000 | Freq: Every day | NASAL | Status: DC

## 2013-03-14 NOTE — Telephone Encounter (Signed)
Please advise on Flonase Rx, pended

## 2013-04-13 NOTE — Progress Notes (Signed)
Subjective:    Patient ID: Dawn Dunn, female    DOB: 22-Apr-1986, 27 y.o.   MRN: [id number] Chief Complaint  Patient presents with  . Follow-up    Fibromyalgia, Synovitis of right hand    HPI  Dawn Dunn is here to f/u.  At last visit, we decided to try some treatments for fibromyalgia.  She does not have a completely typical case as most of the pain is located in her back, but in-depth evaluations throughout her life has failed to find another cause.  She is actually doing much better.  She feels that the elavil is helping, she is sleeping better and taking the prn flexeril tends to help her sxs a lot, esp at work as well.  She was seen sev wks ago for swelling of her right hand.  She brought in cell phone pics and it is really quite impressive. There was no trauma or injury - just woke w/ it like that.  She was seen her by Dr. Denyse Amass - our sports-medicine fellow, and placed of meloxicam for synovitis.  This helped a lot and within a few days her hand was back to normal.  Did not have any problems w/ it since.  Past Medical History  Diagnosis Date  . Kidney stones   . Anemia   . Fibromyalgia   . Mild cervical dysplasia   . Asthma   . Allergy   . Depression    Current Outpatient Prescriptions on File Prior to Visit  Medication Sig Dispense Refill  . cholecalciferol (VITAMIN D) 1000 UNITS tablet Take 1,000 Units by mouth daily.      . Norgestimate-Ethinyl Estradiol Triphasic (TRI-SPRINTEC) 0.18/0.215/0.25 MG-35 MCG tablet Take 1 tablet by mouth daily.  1 Package  11   No current facility-administered medications on file prior to visit.   Allergies  Allergen Reactions  . Ketorolac Tromethamine Other (See Comments)    Pain at the injection site  . Hydrocodone-Acetaminophen Rash    Only allergic to [employer]; Hydrocodone tablets are ok   Review of Systems  Constitutional: Negative for fever and chills.  Gastrointestinal: Negative for nausea, vomiting, abdominal pain, diarrhea  and constipation.  Genitourinary: Negative for urgency, frequency, decreased urine volume and difficulty urinating.  Musculoskeletal: Positive for myalgias, back pain and arthralgias. Negative for joint swelling and gait problem.  Neurological: Negative for weakness and numbness.  Hematological: Negative for adenopathy. Does not bruise/bleed easily.      BP 108/76  Pulse 92  Temp(Src) 98.1 F (36.7 C) (Oral)  Resp 16  Ht 5\' 4"  (1.626 m)  Wt 197 lb 9.6 oz (89.631 kg)  BMI 33.9 kg/m2  SpO2 98%  LMP 01/31/2013 Objective:   Physical Exam  Constitutional: She is oriented to person, place, and time. She appears well-developed and well-nourished. No distress.  HENT:  Head: Normocephalic and atraumatic.  Right Ear: External ear normal.  Left Ear: External ear normal.  Eyes: Conjunctivae are normal. No scleral icterus.  Neck: Normal range of motion. Neck supple. No thyromegaly present.  Cardiovascular: Normal rate, regular rhythm, normal heart sounds and intact distal pulses.   Pulmonary/Chest: Effort normal and breath sounds normal. No respiratory distress.  Musculoskeletal: She exhibits no edema.       Right hand: Normal. She exhibits normal range of motion, no tenderness, normal capillary refill and no swelling. Normal sensation noted.  Lymphadenopathy:    She has no cervical adenopathy.  Neurological: She is alert and oriented to person, place, and time.  Skin: Skin is warm and dry. She is not diaphoretic. No erythema.  Psychiatric: She has a normal mood and affect. Her behavior is normal.      Assessment & Plan:  Arthritis - Plan: Sedimentation rate, C-reactive protein  Back pain, chronic - Plan: cyclobenzaprine (FLEXERIL) 10 MG tablet, meloxicam (MOBIC) 15 MG tablet, amitriptyline (ELAVIL) 100 MG tablet  Fibromyalgia - increase elavil and ok to cont prn flexeril and mobic.  Doing much better.  Synovitis of hand - resolved.  Meds ordered this encounter  Medications  .  cyclobenzaprine (FLEXERIL) 10 MG tablet    Sig: Take 1 tablet (10 mg total) by mouth 3 (three) times daily as needed for muscle spasms.    Dispense:  90 tablet    Refill:  0  . meloxicam (MOBIC) 15 MG tablet    Sig: Take 1 tablet (15 mg total) by mouth daily as needed for pain.    Dispense:  30 tablet    Refill:  1  . amitriptyline (ELAVIL) 100 MG tablet    Sig: Take 1 tablet (100 mg total) by mouth at bedtime.    Dispense:  30 tablet    Refill:  2

## 2013-05-02 ENCOUNTER — Ambulatory Visit: Payer: BC Managed Care – PPO

## 2013-05-02 ENCOUNTER — Encounter: Payer: Self-pay | Admitting: Family Medicine

## 2013-05-02 ENCOUNTER — Ambulatory Visit (INDEPENDENT_AMBULATORY_CARE_PROVIDER_SITE_OTHER): Payer: BC Managed Care – PPO | Admitting: Family Medicine

## 2013-05-02 VITALS — BP 108/70 | HR 94 | Temp 98.8°F | Resp 16 | Ht 64.0 in | Wt 208.6 lb

## 2013-05-02 DIAGNOSIS — Z20828 Contact with and (suspected) exposure to other viral communicable diseases: Secondary | ICD-10-CM

## 2013-05-02 DIAGNOSIS — F329 Major depressive disorder, single episode, unspecified: Secondary | ICD-10-CM

## 2013-05-02 DIAGNOSIS — G8929 Other chronic pain: Secondary | ICD-10-CM

## 2013-05-02 DIAGNOSIS — R590 Localized enlarged lymph nodes: Secondary | ICD-10-CM

## 2013-05-02 DIAGNOSIS — R599 Enlarged lymph nodes, unspecified: Secondary | ICD-10-CM

## 2013-05-02 DIAGNOSIS — Z205 Contact with and (suspected) exposure to viral hepatitis: Secondary | ICD-10-CM

## 2013-05-02 DIAGNOSIS — M549 Dorsalgia, unspecified: Secondary | ICD-10-CM

## 2013-05-02 DIAGNOSIS — N200 Calculus of kidney: Secondary | ICD-10-CM

## 2013-05-02 MED ORDER — DEXLANSOPRAZOLE 60 MG PO CPDR: 60.0000 mg | DELAYED_RELEASE_CAPSULE | Freq: Every day | ORAL | Status: DC

## 2013-05-02 MED ORDER — CYCLOBENZAPRINE HCL 10 MG PO TABS: 10.0000 mg | ORAL_TABLET | Freq: Three times a day (TID) | ORAL | Status: DC | PRN

## 2013-05-02 MED ORDER — DULOXETINE HCL 60 MG PO CPEP: 60.0000 mg | ORAL_CAPSULE | Freq: Every day | ORAL | Status: DC

## 2013-05-02 MED ORDER — AMITRIPTYLINE HCL 100 MG PO TABS: 100.0000 mg | ORAL_TABLET | Freq: Every day | ORAL | Status: DC

## 2013-05-02 MED ORDER — MELOXICAM 15 MG PO TABS: 15.0000 mg | ORAL_TABLET | Freq: Every day | ORAL | Status: DC | PRN

## 2013-05-02 NOTE — Progress Notes (Signed)
  Subjective:    Patient ID: Dawn Dunn, female    DOB: [birth year], 27 y.o.   MRN: [id number]  HPI did develop dry mouth on the elavil which helps a lot with sleep but not as much with pain.  But then hangover effect in the morning.  The meds are helping a lot with her sleep but not with her pain at all.  Her upper back has been especially bad , for several days she couldn't move sev ways due to pain.  And the ice helped.  Icy hot helps a lot. Has tried a lidoderm patch in the past which helped a lot.    Review of Systems     Objective:   Physical Exam       [facility] reading (PRIMARY) by  Dr. Clelia Croft. No acute abnormality - no sig increased lymphadenopathy as concern from prior CXR.  Assessment & Plan:

## 2013-05-03 ENCOUNTER — Encounter: Payer: Self-pay | Admitting: Family Medicine

## 2013-05-04 ENCOUNTER — Encounter: Payer: Self-pay | Admitting: Family Medicine

## 2013-05-05 ENCOUNTER — Ambulatory Visit: Payer: BC Managed Care – PPO | Admitting: Physician Assistant

## 2013-05-05 NOTE — Telephone Encounter (Signed)
Please advise if the family members of this patient should be checked for Hep C. The coupon issue was resolved.

## 2013-05-06 LAB — STONE ANALYSIS: Stone Weight KSTONE: 0.011 g

## 2013-05-08 ENCOUNTER — Telehealth: Payer: Self-pay

## 2013-05-08 NOTE — Telephone Encounter (Signed)
Left voice mail for patient to notify her that the fax for her prescription request to pain management did not transmit after several attempts.  If patient has a number we could use to contact them, please notify our office and we will be glad to refax the request. There is no phone number on file for the pain management in question.

## 2013-05-13 ENCOUNTER — Telehealth: Payer: Self-pay | Admitting: Radiology

## 2013-05-13 NOTE — Telephone Encounter (Signed)
Have you gotten the prior auth request for her dexilant? I did not see it. I asked patient to contact the pharmacy to send [place] a prior auth request. Dawn Dunn

## 2013-05-14 NOTE — Telephone Encounter (Signed)
No I have not received any PA req from pharmacy. I will address it as soon as I receive it.

## 2013-05-14 NOTE — Telephone Encounter (Signed)
LMOM for CB. Doing PA for dexilant. Verify w/pt if she takes this for GERD. Also ask her if she has tried other medications in the past and results.

## 2013-05-16 NOTE — Telephone Encounter (Signed)
LMOM to CB. 

## 2013-05-19 NOTE — Telephone Encounter (Signed)
LMOM to CB. 

## 2013-05-20 ENCOUNTER — Telehealth: Payer: Self-pay

## 2013-05-20 NOTE — Telephone Encounter (Signed)
Pt is returning Dawn Dunn's call from a few days ago Call back number is [phone]

## 2013-05-23 NOTE — Telephone Encounter (Signed)
Pt called back and verified that she is being Rxd Dexilant for GERD and she has been using Zantac and has tried Tums and Rolaids OTC, but no other PPIs.

## 2013-05-23 NOTE — Telephone Encounter (Signed)
Completed form and faxed to ins.

## 2013-05-23 NOTE — Telephone Encounter (Signed)
See notes under prev phone message.

## 2013-05-25 ENCOUNTER — Other Ambulatory Visit: Payer: Self-pay | Admitting: Family Medicine

## 2013-06-07 ENCOUNTER — Encounter: Payer: Self-pay | Admitting: Family Medicine

## 2013-06-13 ENCOUNTER — Telehealth: Payer: Self-pay | Admitting: *Deleted

## 2013-06-13 NOTE — Telephone Encounter (Signed)
Insurance denied Dexilant.  Alternatives would they would pay for is omeprazole, Prevacid, Protonix, Zegarid, Aciphex

## 2013-06-16 ENCOUNTER — Encounter: Payer: Self-pay | Admitting: Family Medicine

## 2013-06-16 MED ORDER — OMEPRAZOLE 40 MG PO CPDR: 40.0000 mg | DELAYED_RELEASE_CAPSULE | Freq: Every day | ORAL | Status: DC

## 2013-06-16 NOTE — Telephone Encounter (Signed)
Rx for omeprazole sent to pharmacy

## 2013-07-04 ENCOUNTER — Encounter: Payer: Self-pay | Admitting: Family Medicine

## 2013-07-04 ENCOUNTER — Ambulatory Visit: Payer: BC Managed Care – PPO

## 2013-07-04 ENCOUNTER — Ambulatory Visit (INDEPENDENT_AMBULATORY_CARE_PROVIDER_SITE_OTHER): Payer: BC Managed Care – PPO | Admitting: Family Medicine

## 2013-07-04 VITALS — BP 120/80 | HR 94 | Temp 98.9°F | Resp 16 | Ht 64.0 in | Wt 222.0 lb

## 2013-07-04 DIAGNOSIS — R635 Abnormal weight gain: Secondary | ICD-10-CM

## 2013-07-04 DIAGNOSIS — L309 Dermatitis, unspecified: Secondary | ICD-10-CM

## 2013-07-04 DIAGNOSIS — M545 Low back pain: Secondary | ICD-10-CM

## 2013-07-04 DIAGNOSIS — M129 Arthropathy, unspecified: Secondary | ICD-10-CM

## 2013-07-04 DIAGNOSIS — L259 Unspecified contact dermatitis, unspecified cause: Secondary | ICD-10-CM

## 2013-07-04 DIAGNOSIS — G894 Chronic pain syndrome: Secondary | ICD-10-CM

## 2013-07-04 DIAGNOSIS — M549 Dorsalgia, unspecified: Secondary | ICD-10-CM

## 2013-07-04 DIAGNOSIS — G8929 Other chronic pain: Secondary | ICD-10-CM

## 2013-07-04 DIAGNOSIS — K219 Gastro-esophageal reflux disease without esophagitis: Secondary | ICD-10-CM

## 2013-07-04 DIAGNOSIS — M1990 Unspecified osteoarthritis, unspecified site: Secondary | ICD-10-CM

## 2013-07-04 DIAGNOSIS — E559 Vitamin D deficiency, unspecified: Secondary | ICD-10-CM

## 2013-07-04 LAB — GLUCOSE, POCT (MANUAL RESULT ENTRY): POC Glucose: 98 mg/dl (ref 70–99)

## 2013-07-04 MED ORDER — AMITRIPTYLINE HCL 100 MG PO TABS: 100.0000 mg | ORAL_TABLET | Freq: Every day | ORAL | Status: DC

## 2013-07-04 MED ORDER — PREDNISONE 20 MG PO TABS: ORAL_TABLET | ORAL | Status: DC

## 2013-07-04 MED ORDER — OMEPRAZOLE 40 MG PO CPDR: 40.0000 mg | DELAYED_RELEASE_CAPSULE | Freq: Every day | ORAL | Status: DC

## 2013-07-04 MED ORDER — MELOXICAM 15 MG PO TABS: 15.0000 mg | ORAL_TABLET | Freq: Every day | ORAL | Status: DC | PRN

## 2013-07-04 MED ORDER — DULOXETINE HCL 60 MG PO CPEP: 60.0000 mg | ORAL_CAPSULE | Freq: Every day | ORAL | Status: DC

## 2013-07-04 MED ORDER — TRIAMCINOLONE ACETONIDE 0.1 % EX CREA: TOPICAL_CREAM | Freq: Two times a day (BID) | CUTANEOUS | Status: DC

## 2013-07-04 MED ORDER — FLUTICASONE PROPIONATE 50 MCG/ACT NA SUSP: 2.0000 | Freq: Every day | NASAL | Status: DC

## 2013-07-04 NOTE — Progress Notes (Signed)
Subjective:    Patient ID: Dawn Dunn, female    DOB: 08/13/1986, 27 y.o.   MRN: [id number] Chief Complaint  Patient presents with  . Follow-up    BACK   HPI  Still with elavil dry mouth.  However, when she went out of town she accidentally left her elavil and her mobic at home and had MUCH more back pain so can definitely tell that they are helping.  Doing well on the cymbalta although hasn't noticed much difference in her pain level since starting it - makes her tired and occ hang-over effect in mornings. Flexeril doesn't help so rarly takes it.    Was sitting a lot of time and since then her low back has started bothering her - will shoot down either leg - momentarily, has noticed weakness in her legs though no numbness. No f/c, no changes in bowels or bladder. Did sleep on the floor sev wks ago and had more pain after htsi.  Gained more weight.  Has gained 20 lbs in the past few months. Not getting exercise but not different from normal.  Has been getting dizzy and nauseated lately.  Doesn't help when she eats so not low blood sugar. Drinks a lot coffee and not much water.  Prilosec seems to be doing really well for her so having much less heartburn.  Went to a chiropractor sev wks ago - he said she had short muscles.  She did PT when she was first diagnosed at 27 yo with back pain which did help.  She did not have $$ to cont to follow-up with chiropractor.  Has really bad eczema on her first toes her whole life, would like a cream.  Review of Systems  Constitutional: Positive for fatigue and unexpected weight change. Negative for fever, chills, activity change and appetite change.  HENT: Positive for neck pain and neck stiffness. Negative for drooling, mouth sores and voice change.   Gastrointestinal: Positive for nausea. Negative for abdominal pain, diarrhea and constipation.  Genitourinary: Negative for dysuria, urgency, frequency, decreased urine volume, enuresis and difficulty  urinating.  Musculoskeletal: Positive for myalgias, back pain, joint swelling and arthralgias. Negative for gait problem.  Skin: Positive for rash.       Nails pitting  Neurological: Positive for dizziness, weakness, light-headedness and headaches. Negative for numbness.      BP 120/80  Pulse 94  Temp(Src) 98.9 F (37.2 C) (Oral)  Resp 16  Ht 5\' 4"  (1.626 m)  Wt 222 lb (100.699 kg)  BMI 38.09 kg/m2  SpO2 97%  LMP 06/19/2013 Objective:   Physical Exam  Constitutional: She is oriented to person, place, and time. She appears well-developed and well-nourished. No distress.  HENT:  Head: Normocephalic and atraumatic.  Right Ear: External ear normal.  Eyes: Conjunctivae are normal. No scleral icterus.  Pulmonary/Chest: Effort normal.  Neurological: She is alert and oriented to person, place, and time.  Skin: Skin is warm and dry. She is not diaphoretic. No erythema.  Psychiatric: She has a normal mood and affect. Her behavior is normal.     [facility] reading (PRIMARY) by  Dr. Clelia Croft. L-spine xray: No acute abnormality seen. Assessment & Plan:  Weight gain, abnormal - Plan: TSH, C-reactive protein, POCT glucose (manual entry)  Low back pain - Plan: Sedimentation Rate  Chronic pain syndrome - Plan: Sedimentation Rate  Inflammatory arthropathy - Plan: TSH  Eczema - try kenalog  Unspecified vitamin D deficiency - Plan: Vitamin D, 25-hydroxy  GERD (gastroesophageal reflux  disease)  Back pain, chronic - Plan: amitriptyline (ELAVIL) 100 MG tablet, meloxicam (MOBIC) 15 MG tablet, DG Lumbar Spine Complete - Pt has had multiple specialist evals over the past 13 yrs due to her chronic back pain with rheum, ortho, chiro, PT, etc.  If pain continues, she would be happy to try PT again if she can afford it.  Her esr and crp are chronically elevated, however, ana, anti-dsdna, rf, cpk, tsh, cmp all nml. Cons check anti-ccp. For now will do emperic trial of prednisone - follow-up in 2 wks when just  finished prednisone and recheck esr/crp to see if they decreased. If her pain improved on prednisone, consider repeat rheum eval.  Pt does have some severe eczematous type rash on her toes but also has nail pitting - wonder if she could have a psoriatic arthritis-type picture???  Meds ordered this encounter  Medications  . triamcinolone cream (KENALOG) 0.1 %    Sig: Apply topically 2 (two) times daily.    Dispense:  454 g    Refill:  3  . predniSONE (DELTASONE) 20 MG tablet    Sig: Take 3 tabs qd x 3d, take 2 tabs qd x 3d, take 1 tab qd x 1 wk, then 1/2 tab qd x 1 wk.    Dispense:  26 tablet    Refill:  0  . amitriptyline (ELAVIL) 100 MG tablet    Sig: Take 1 tablet (100 mg total) by mouth at bedtime.    Dispense:  90 tablet    Refill:  1  . meloxicam (MOBIC) 15 MG tablet    Sig: Take 1 tablet (15 mg total) by mouth daily as needed for pain.    Dispense:  30 tablet    Refill:  1  . fluticasone (FLONASE) 50 MCG/ACT nasal spray    Sig: Place 2 sprays into the nose daily.    Dispense:  16 g    Refill:  11  . omeprazole (PRILOSEC) 40 MG capsule    Sig: Take 1 capsule (40 mg total) by mouth daily.    Dispense:  90 capsule    Refill:  1    Order Specific Question:  Supervising Provider    Answer:  DOOLITTLE, ROBERT P [[id number]]  . DULoxetine (CYMBALTA) 60 MG capsule    Sig: Take 1 capsule (60 mg total) by mouth daily.    Dispense:  90 capsule    Refill:  1

## 2013-07-04 NOTE — Patient Instructions (Addendum)
Stay on the prilosec since it is working for now.  If you are having breakthrough stomach problems, heartburn, indigestion, try adding in a zantac (ranitidine) or pepcid (famotidine).  Stop the mobic while you are on the prednisone but you can restart it after you complete it.l

## 2013-07-05 LAB — TSH: TSH: 2.475 u[IU]/mL (ref 0.350–4.500)

## 2013-07-05 LAB — C-REACTIVE PROTEIN: CRP: 2.8 mg/dL — ABNORMAL HIGH (ref <0.60)

## 2013-07-07 ENCOUNTER — Encounter: Payer: Self-pay | Admitting: Family Medicine

## 2013-07-25 ENCOUNTER — Encounter: Payer: Self-pay | Admitting: Family Medicine

## 2013-07-25 ENCOUNTER — Ambulatory Visit (INDEPENDENT_AMBULATORY_CARE_PROVIDER_SITE_OTHER): Payer: BC Managed Care – PPO | Admitting: Family Medicine

## 2013-07-25 VITALS — BP 111/71 | HR 100 | Temp 98.4°F | Resp 18 | Ht 64.0 in | Wt 224.0 lb

## 2013-07-25 DIAGNOSIS — M2550 Pain in unspecified joint: Secondary | ICD-10-CM

## 2013-07-25 DIAGNOSIS — M549 Dorsalgia, unspecified: Secondary | ICD-10-CM

## 2013-07-25 DIAGNOSIS — G8929 Other chronic pain: Secondary | ICD-10-CM

## 2013-07-25 DIAGNOSIS — R252 Cramp and spasm: Secondary | ICD-10-CM

## 2013-07-25 MED ORDER — HYDROCODONE-ACETAMINOPHEN 5-325 MG PO TABS: 1.0000 | ORAL_TABLET | Freq: Four times a day (QID) | ORAL | Status: DC | PRN

## 2013-07-25 MED ORDER — METAXALONE 800 MG PO TABS: 800.0000 mg | ORAL_TABLET | Freq: Three times a day (TID) | ORAL | Status: DC

## 2013-07-25 MED ORDER — MELOXICAM 15 MG PO TABS: 15.0000 mg | ORAL_TABLET | Freq: Every day | ORAL | Status: DC | PRN

## 2013-07-25 NOTE — Progress Notes (Signed)
Subjective:    Patient ID: Dawn Dunn, female    DOB: [birth year], 27 y.o.   MRN: [id number] Chief Complaint  Patient presents with  . Follow-up    back pain    HPI  Dawn Dunn has learned that her paternal grandmother with psoriatic arthritis and father and paternal grandfather with psoriasis. Now upon reflection, after we talked last visit she has had a chance to look into the symptoms of psoriasis more and found that she has had most of them - just never connected them all together or saw a dr for them - has had scaly painful flaking patches in scalp in winter - def not dandruff, prone to dry skin, always had the nail pitting as well as chronic nail changes as well as scaling on her toes and hands - has improved substantially with kenalog and prednisone that we tried at last visit.  She did not see any improvement in her pain from the course of prednisone but it did make her hungry and moody so she has continued to gain weight.  Past Medical History  Diagnosis Date  . Kidney stones   . Anemia   . Fibromyalgia   . Mild cervical dysplasia   . Asthma   . Allergy   . Depression    Current Outpatient Prescriptions on File Prior to Visit  Medication Sig Dispense Refill  . amitriptyline (ELAVIL) 100 MG tablet Take 1 tablet (100 mg total) by mouth at bedtime.  90 tablet  1  . cetirizine (ZYRTEC) 10 MG chewable tablet Chew 10 mg by mouth daily.      . cholecalciferol (VITAMIN D) 1000 UNITS tablet Take 1,000 Units by mouth daily.      . DULoxetine (CYMBALTA) 60 MG capsule Take 1 capsule (60 mg total) by mouth daily.  90 capsule  1  . fluticasone (FLONASE) 50 MCG/ACT nasal spray Place 2 sprays into the nose daily.  16 g  11  . Norgestimate-Ethinyl Estradiol Triphasic (TRI-SPRINTEC) 0.18/0.215/0.25 MG-35 MCG tablet Take 1 tablet by mouth daily.  1 Package  11  . omeprazole (PRILOSEC) 40 MG capsule Take 1 capsule (40 mg total) by mouth daily.  90 capsule  1  . triamcinolone cream (KENALOG)  0.1 % Apply topically 2 (two) times daily.  454 g  3   No current facility-administered medications on file prior to visit.   Allergies  Allergen Reactions  . Ketorolac Tromethamine Other (See Comments)    Pain at the injection site  . Hydrocodone-Acetaminophen Rash    Only allergic to [employer]; Hydrocodone tablets are ok     Review of Systems  Constitutional: Negative for fever and chills.  Gastrointestinal: Negative for nausea, vomiting, abdominal pain, diarrhea and constipation.  Genitourinary: Negative for urgency, frequency, decreased urine volume and difficulty urinating.  Musculoskeletal: Positive for myalgias, back pain and arthralgias. Negative for joint swelling and gait problem.  Skin: Positive for rash.  Neurological: Negative for weakness and numbness.  Hematological: Negative for adenopathy. Does not bruise/bleed easily.  Psychiatric/Behavioral: Positive for sleep disturbance and dysphoric mood.      BP 111/71  Pulse 100  Temp(Src) 98.4 F (36.9 C)  Resp 18  Ht 5\' 4"  (1.626 m)  Wt 224 lb (101.606 kg)  BMI 38.43 kg/m2  LMP 06/19/2013 Objective:   Physical Exam  Constitutional: She is oriented to person, place, and time. She appears well-developed and well-nourished. No distress.  HENT:  Head: Normocephalic and atraumatic.  Right Ear: External ear normal.  Left Ear: External ear normal.  Eyes: Conjunctivae are normal. No scleral icterus.  Neck: Normal range of motion. Neck supple. No thyromegaly present.  Cardiovascular: Normal rate, regular rhythm, normal heart sounds and intact distal pulses.   Pulmonary/Chest: Effort normal and breath sounds normal. No respiratory distress.  Musculoskeletal: She exhibits no edema.       Thoracic back: She exhibits decreased range of motion and spasm. She exhibits no bony tenderness.       Lumbar back: She exhibits decreased range of motion and spasm. She exhibits no bony tenderness.  Lymphadenopathy:    She has no  cervical adenopathy.  Neurological: She is alert and oriented to person, place, and time.  Skin: Skin is warm and dry. Rash noted. She is not diaphoretic. No erythema.  Erythematous raw scaling rash on right thenar eminence Nails with pitting at bases.  Psychiatric: She has a normal mood and affect. Her behavior is normal.      Assessment & Plan:  Arthralgia - Plan: Ambulatory referral to Rheumatology, C-reactive protein, Sedimentation rate - concern for poss psoriatic arthritis.  Leg cramp - Plan: CK, Magnesium, Comprehensive metabolic panel  Back pain, chronic - Plan: meloxicam (MOBIC) 15 MG tablet, no benefit from flexeril so will try skelaxin instead as worked in past.  Having more acute pain currently and has to do lots of lifting at work this weekend so some temporary hydrocodone. However, if rheum visit is far away may need to reconsider pursuing topical treatment (topical advanced pain [facility]) and/or trial of neuron tin/lyrica.  Meds ordered this encounter  Medications  . meloxicam (MOBIC) 15 MG tablet    Sig: Take 1 tablet (15 mg total) by mouth daily as needed for pain.    Dispense:  30 tablet    Refill:  1  . HYDROcodone-acetaminophen (NORCO/VICODIN) 5-325 MG per tablet    Sig: Take 1 tablet by mouth every 6 (six) hours as needed for pain.    Dispense:  30 tablet    Refill:  0  . metaxalone (SKELAXIN) 800 MG tablet    Sig: Take 1 tablet (800 mg total) by mouth 3 (three) times daily.    Dispense:  90 tablet    Refill:  0

## 2013-07-25 NOTE — Patient Instructions (Signed)
Next steps we could consider for your pain is additing in a trial of Neurontin or Lyrica for nerve pain or considering a topical solution through [facility]

## 2013-07-26 ENCOUNTER — Encounter: Payer: Self-pay | Admitting: Family Medicine

## 2013-07-26 LAB — COMPREHENSIVE METABOLIC PANEL
ALT: 23 U/L (ref 0–35)
AST: 18 U/L (ref 0–37)
Albumin: 4.1 g/dL (ref 3.5–5.2)
BUN: 12 mg/dL (ref 6–23)
CO2: 30 mEq/L (ref 19–32)
Calcium: 9.2 mg/dL (ref 8.4–10.5)
Chloride: 101 mEq/L (ref 96–112)
Creat: 0.63 mg/dL (ref 0.50–1.10)
Potassium: 4.4 mEq/L (ref 3.5–5.3)

## 2013-08-07 ENCOUNTER — Ambulatory Visit (INDEPENDENT_AMBULATORY_CARE_PROVIDER_SITE_OTHER): Payer: BC Managed Care – PPO | Admitting: Family Medicine

## 2013-08-07 VITALS — BP 126/78 | HR 106 | Temp 98.5°F | Resp 18 | Ht 64.2 in | Wt 225.2 lb

## 2013-08-07 DIAGNOSIS — R112 Nausea with vomiting, unspecified: Secondary | ICD-10-CM

## 2013-08-07 DIAGNOSIS — B349 Viral infection, unspecified: Secondary | ICD-10-CM

## 2013-08-07 DIAGNOSIS — R51 Headache: Secondary | ICD-10-CM

## 2013-08-07 DIAGNOSIS — IMO0001 Reserved for inherently not codable concepts without codable children: Secondary | ICD-10-CM

## 2013-08-07 DIAGNOSIS — J029 Acute pharyngitis, unspecified: Secondary | ICD-10-CM

## 2013-08-07 DIAGNOSIS — B9789 Other viral agents as the cause of diseases classified elsewhere: Secondary | ICD-10-CM

## 2013-08-07 LAB — POCT INFLUENZA A/B
Influenza A, POC: NEGATIVE
Influenza B, POC: NEGATIVE

## 2013-08-07 LAB — POCT CBC
Granulocyte percent: 75.4 %G (ref 37–80)
HCT, POC: 36.2 % — AB (ref 37.7–47.9)
Hemoglobin: 11.1 g/dL — AB (ref 12.2–16.2)
Lymph, poc: 1.2 (ref 0.6–3.4)
MCH, POC: 29.1 pg (ref 27–31.2)
MCHC: 30.7 g/dL — AB (ref 31.8–35.4)
MCV: 94.9 fL (ref 80–97)
MID (cbc): 0.3 (ref 0–0.9)
MPV: 9.4 fL (ref 0–99.8)
POC Granulocyte: 4.8 (ref 2–6.9)
POC LYMPH PERCENT: 19.3 %L (ref 10–50)
POC MID %: 5.3 % (ref 0–12)
Platelet Count, POC: 268 10*3/uL (ref 142–424)
RBC: 3.81 M/uL — AB (ref 4.04–5.48)
RDW, POC: 13.9 %
WBC: 6.3 10*3/uL (ref 4.6–10.2)

## 2013-08-07 LAB — POCT RAPID STREP A (OFFICE): Rapid Strep A Screen: NEGATIVE

## 2013-08-07 MED ORDER — BUTALBITAL-APAP-CAFF-COD 50-325-40-30 MG PO CAPS: 1.0000 | ORAL_CAPSULE | ORAL | Status: DC | PRN

## 2013-08-07 MED ORDER — PROMETHAZINE HCL 25 MG PO TABS: 25.0000 mg | ORAL_TABLET | Freq: Three times a day (TID) | ORAL | Status: DC | PRN

## 2013-08-07 NOTE — Patient Instructions (Signed)
Headache Headaches are caused by many different problems. Most commonly, headache is caused by muscle tension from an injury, fatigue, or emotional upset. Excessive muscle contractions in the scalp and neck result in a headache that often feels like a tight band around the head. Tension headaches often have areas of tenderness over the scalp and the back of the neck. These headaches may last for hours, days, or longer, and some may contribute to migraines in those who have migraine problems. Migraines usually cause a throbbing headache, which is made worse by activity. Sometimes only one side of the head hurts. Nausea, vomiting, eye pain, and avoidance of food are common with migraines. Visual symptoms such as light sensitivity, blind spots, or flashing lights may also occur. Loud noises may worsen migraine headaches. Many factors may cause migraine headaches:  Emotional stress, lack of sleep, and menstrual periods.   Alcohol and some drugs (such as birth control pills).   Diet factors (fasting, caffeine, food preservatives, chocolate).   Environmental factors (weather changes, bright lights, odors, smoke).  Other causes of headaches include minor injuries to the head. Arthritis in the neck; problems with the jaw, eyes, ears, or nose are also causes of headaches. Allergies, drugs, alcohol, and exposure to smoke can also cause moderate headaches. Rebound headaches can occur if someone uses pain medications for a long period of time and then stops. Less commonly, blood vessel problems in the neck and brain (including stroke) can cause various types of headache. Treatment of headaches includes medicines for pain and relaxation. Ice packs or heat applied to the back of the head and neck help some people. Massaging the shoulders, neck and scalp are often very useful. Relaxation techniques and stretching can help prevent these headaches. Avoid alcohol and cigarette smoking as these tend to make headaches  worse. Please see your caregiver if your headache is not better in 2 days.  SEEK IMMEDIATE MEDICAL CARE IF:   You develop a high fever, chills, or repeated vomiting.   You faint or have difficulty with vision.   You develop unusual numbness or weakness of your arms or legs.   Relief of pain is inadequate with medication, or you develop severe pain.   You develop confusion, or neck stiffness.   You have a worsening of a headache or do not obtain relief.  Document Released: 12/11/2005 Document Revised: 11/30/2011 Document Reviewed: 06/06/2007 [employer] Patient Information 2012 [employer]. 

## 2013-08-07 NOTE — Progress Notes (Signed)
[facility]:  Office Visit  Chief Complaint:  Chief Complaint  Patient presents with  . Headache    started around 3 am   . Nausea  . Emesis    HPI: Dawn Dunn is a 27 y.o. female who complains of  HA behind eyes, forehead and neck , 7/10 throbbing pain which woke her up from sleep  She has had muscle aches, nausea, nonbloody vomiting x 2 No new travels, no new meds, no sick contacts, No new rashes No history of migraine/HAs Felt like her head was going to explode She has tried rx strength ibuprofen She has had chills, dizziness Deneis CP, SOB, palpitations, double vision, She had double vision when she first woke up since hard to keep eyes open She is light sensitive, denies eye pain.  She has had sore throat  Past Medical History  Diagnosis Date  . Kidney stones   . Anemia   . Fibromyalgia   . Mild cervical dysplasia   . Asthma   . Allergy   . Depression    Past Surgical History  Procedure Laterality Date  . Right ankle fracture    . Wisdom tooth extraction    . Cystoscopy    . Cervical cyrosurgery     History   Social History  . Marital Status: Single    Spouse Name: N/A    Number of Children: N/A  . Years of Education: N/A   Social History Main Topics  . Smoking status: Former Smoker -- 1.00 packs/day for 8 years    Quit date: 11/25/2012  . Smokeless tobacco: Never Used  . Alcohol Use: Yes     Comment: rarely  . Drug Use: No  . Sexual Activity: No   Other Topics Concern  . None   Social History Narrative   Patient is an Haematologist. She exercises walking 1-2 times weekly for 30 minutes.   Family History  Problem Relation Age of Onset  . Diabetes Father   . Depression Father   . Hyperlipidemia Father    Allergies  Allergen Reactions  . Ketorolac Tromethamine Other (See Comments)    Pain at the injection site  . Hydrocodone-Acetaminophen Rash    Only allergic to [employer]; Hydrocodone tablets are  ok   Prior to Admission medications   Medication Sig Start Date End Date Taking? Authorizing Provider  amitriptyline (ELAVIL) 100 MG tablet Take 1 tablet (100 mg total) by mouth at bedtime. 07/04/13  Yes Sherren Mocha, MD  cetirizine (ZYRTEC) 10 MG chewable tablet Chew 10 mg by mouth daily.   Yes Historical Provider, MD  cholecalciferol (VITAMIN D) 1000 UNITS tablet Take 1,000 Units by mouth daily.   Yes Historical Provider, MD  DULoxetine (CYMBALTA) 60 MG capsule Take 1 capsule (60 mg total) by mouth daily. 07/04/13  Yes Sherren Mocha, MD  fluticasone (FLONASE) 50 MCG/ACT nasal spray Place 2 sprays into the nose daily. 07/04/13  Yes Sherren Mocha, MD  HYDROcodone-acetaminophen (NORCO/VICODIN) 5-325 MG per tablet Take 1 tablet by mouth every 6 (six) hours as needed for pain. 07/25/13  Yes Sherren Mocha, MD  meloxicam (MOBIC) 15 MG tablet Take 1 tablet (15 mg total) by mouth daily as needed for pain. 07/25/13  Yes Sherren Mocha, MD  metaxalone (SKELAXIN) 800 MG tablet Take 1 tablet (800 mg total) by mouth 3 (three) times daily. 07/25/13  Yes Sherren Mocha, MD  omeprazole (PRILOSEC) 40 MG capsule Take 1 capsule (  40 mg total) by mouth daily. 07/04/13  Yes Sherren Mocha, MD  triamcinolone cream (KENALOG) 0.1 % Apply topically 2 (two) times daily. 07/04/13  Yes Sherren Mocha, MD  Norgestimate-Ethinyl Estradiol Triphasic (TRI-SPRINTEC) 0.18/0.215/0.25 MG-35 MCG tablet Take 1 tablet by mouth daily. 12/24/12   Sherren Mocha, MD     ROS: The patient denies fevers, chills, night sweats, unintentional weight loss, chest pain, palpitations, wheezing, dyspnea on exertion, , abdominal pain, dysuria, hematuria, melena, numbness, , or tingling.   All other systems have been reviewed and were otherwise negative with the exception of those mentioned in the HPI and as above.    PHYSICAL EXAM: Filed Vitals:   08/07/13 1243  BP: 126/78  Pulse: 106  Temp: 98.5 F (36.9 C)  Resp: 18   Filed Vitals:   08/07/13 1243  Height: 5' 4.2" (1.631 m)   Weight: 225 lb 3.2 oz (102.15 kg)   Body mass index is 38.4 kg/(m^2).  General: Alert, mild distress, obese white female HEENT:  Normocephalic, atraumatic, oropharynx patent. No  exudates, + throat erythema, Right Tm red but nl EOMI, PERRLA, fundoscopic exam nl Cardiovascular:  Regular rate and rhythm, no rubs murmurs or gallops.  No Carotid bruits, radial pulse intact. No pedal edema.  Respiratory: Clear to auscultation bilaterally.  No wheezes, rales, or rhonchi.  No cyanosis, no use of accessory musculature GI: No organomegaly, abdomen is soft and non-tender, positive bowel sounds.  No masses. Skin: No rashes. Neurologic: Facial musculature symmetric. No e/o nuchal rigidity. CN 2-12 grossly intact Psychiatric: Patient is appropriate throughout our interaction. Lymphatic: No cervical lymphadenopathy Musculoskeletal: Gait intact.   LABS: Results for orders placed in visit on 08/07/13  POCT CBC      Result Value Range   WBC 6.3  4.6 - 10.2 K/uL   Lymph, poc 1.2  0.6 - 3.4   POC LYMPH PERCENT 19.3  10 - 50 %L   MID (cbc) 0.3  0 - 0.9   POC MID % 5.3  0 - 12 %M   POC Granulocyte 4.8  2 - 6.9   Granulocyte percent 75.4  37 - 80 %G   RBC 3.81 (*) 4.04 - 5.48 M/uL   Hemoglobin 11.1 (*) 12.2 - 16.2 g/dL   HCT, POC 81.1 (*) 91.4 - 47.9 %   MCV 94.9  80 - 97 fL   MCH, POC 29.1  27 - 31.2 pg   MCHC 30.7 (*) 31.8 - 35.4 g/dL   RDW, POC 78.2     Platelet Count, POC 268  142 - 424 K/uL   MPV 9.4  0 - 99.8 fL  POCT RAPID STREP A (OFFICE)      Result Value Range   Rapid Strep A Screen Negative  Negative  POCT INFLUENZA A/B      Result Value Range   Influenza A, POC Negative     Influenza B, POC Negative       EKG/XRAY:   Primary read interpreted by Dr. Conley Rolls at [facility].   ASSESSMENT/PLAN: Encounter Diagnoses  Name Primary?  . Myalgia and myositis Yes  . Acute pharyngitis   . Headache(784.0)   . Nausea with vomiting   . Viral illness    Ms Dawn Dunn is an obese  Caucasian woman  who has a complicated medical h/o fibromyaglia, depression, cervical pain who presents with an acute  frontal HA and also HA behind her eyes. She is already on mobic, norco, cymbalta and elavil. She does  not have meningeal signs on exam, neuro exam is normal Rx fioricet with codeine for HA. She may take her other medicines but needs to be cautious of sedating effects Rx Promthazine for nausea, she has taken this before Work note given Push fluids for now F/u prn, I have adives her to get her slightly low Hgb rechecked, there has been no h/o anemia in the past.  Go to ER prn, precautions given for worsening sxs and meningeal signs Gross sideeffects, risk and benefits, and alternatives of medications d/w patient. Patient is aware that all medications have potential sideeffects and we are unable to predict every sideeffect or drug-drug interaction that may occur.     Peng Thorstenson PHUONG, DO 08/07/2013 2:11 PM

## 2013-08-09 LAB — CULTURE, GROUP A STREP: Organism ID, Bacteria: NORMAL

## 2013-08-11 ENCOUNTER — Ambulatory Visit (INDEPENDENT_AMBULATORY_CARE_PROVIDER_SITE_OTHER): Payer: BC Managed Care – PPO | Admitting: Family Medicine

## 2013-08-11 VITALS — BP 100/70 | HR 86 | Temp 98.0°F | Resp 16 | Ht 64.0 in | Wt 228.0 lb

## 2013-08-11 DIAGNOSIS — R21 Rash and other nonspecific skin eruption: Secondary | ICD-10-CM

## 2013-08-11 LAB — POCT CBC
Granulocyte percent: 57.5 %G (ref 37–80)
HCT, POC: 38.3 % (ref 37.7–47.9)
Hemoglobin: 11.7 g/dL — AB (ref 12.2–16.2)
Lymph, poc: 2.4 (ref 0.6–3.4)
MCH, POC: 29.2 pg (ref 27–31.2)
MCHC: 30.5 g/dL — AB (ref 31.8–35.4)
MCV: 95.4 fL (ref 80–97)
MID (cbc): 0.5 (ref 0–0.9)
MPV: 9.2 fL (ref 0–99.8)
POC Granulocyte: 4 (ref 2–6.9)
POC LYMPH PERCENT: 35.3 %L (ref 10–50)
POC MID %: 7.2 %M (ref 0–12)
Platelet Count, POC: 321 10*3/uL (ref 142–424)
RBC: 4.01 M/uL — AB (ref 4.04–5.48)
RDW, POC: 14.3 %
WBC: 6.9 10*3/uL (ref 4.6–10.2)

## 2013-08-11 MED ORDER — PREDNISONE 20 MG PO TABS: ORAL_TABLET | ORAL | Status: DC

## 2013-08-11 NOTE — Progress Notes (Signed)
4 days of progressive palm and sole rash after recent sore throat illness.  She is awaiting a throat culture result.  Feet and hands are keeping awake  Patient has had psoriasis on hands before.  Objective:  NAD Throat mildly erythematous.  No mouth ulcers Both palms and soles of feet are covered with discrete erythematous blisters.  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  Plan: check ASO titer, RPR, sed rate, cbc Prednisone 20 mg 3-2-2-1-1-1.   Rash and nonspecific skin eruption - Plan: predniSONE (DELTASONE) 20 MG tablet, POCT CBC, RPR, Antistreptolysin O titer, Sedimentation Rate, CANCELED: POCT SEDIMENTATION RATE  Signed, Elvina Sidle, MD

## 2013-08-12 ENCOUNTER — Encounter: Payer: Self-pay | Admitting: Family Medicine

## 2013-08-12 LAB — SEDIMENTATION RATE: Sed Rate: 63 mm/hr — ABNORMAL HIGH (ref 0–22)

## 2013-08-12 LAB — ANTISTREPTOLYSIN O TITER: ASO: 220 IU/mL (ref <409)

## 2013-08-12 LAB — RPR

## 2013-08-16 ENCOUNTER — Encounter: Payer: Self-pay | Admitting: Family Medicine

## 2013-10-30 ENCOUNTER — Other Ambulatory Visit: Payer: Self-pay

## 2013-11-28 ENCOUNTER — Ambulatory Visit (INDEPENDENT_AMBULATORY_CARE_PROVIDER_SITE_OTHER): Payer: BC Managed Care – PPO | Admitting: Family Medicine

## 2013-11-28 ENCOUNTER — Encounter: Payer: Self-pay | Admitting: Family Medicine

## 2013-11-28 VITALS — BP 117/73 | HR 92 | Temp 98.5°F | Resp 16 | Ht 64.0 in | Wt 223.0 lb

## 2013-11-28 DIAGNOSIS — Z79899 Other long term (current) drug therapy: Secondary | ICD-10-CM

## 2013-11-28 DIAGNOSIS — Z23 Encounter for immunization: Secondary | ICD-10-CM

## 2013-11-28 DIAGNOSIS — K2970 Gastritis, unspecified, without bleeding: Secondary | ICD-10-CM

## 2013-11-28 DIAGNOSIS — K219 Gastro-esophageal reflux disease without esophagitis: Secondary | ICD-10-CM | POA: Insufficient documentation

## 2013-11-28 DIAGNOSIS — B351 Tinea unguium: Secondary | ICD-10-CM

## 2013-11-28 DIAGNOSIS — M2550 Pain in unspecified joint: Secondary | ICD-10-CM

## 2013-11-28 DIAGNOSIS — IMO0001 Reserved for inherently not codable concepts without codable children: Secondary | ICD-10-CM | POA: Insufficient documentation

## 2013-11-28 DIAGNOSIS — Z3009 Encounter for other general counseling and advice on contraception: Secondary | ICD-10-CM

## 2013-11-28 DIAGNOSIS — B350 Tinea barbae and tinea capitis: Secondary | ICD-10-CM

## 2013-11-28 LAB — COMPREHENSIVE METABOLIC PANEL
ALT: 22 U/L (ref 0–35)
AST: 19 U/L (ref 0–37)
Albumin: 4.3 g/dL (ref 3.5–5.2)
BUN: 11 mg/dL (ref 6–23)
CO2: 27 mEq/L (ref 19–32)
Calcium: 9.2 mg/dL (ref 8.4–10.5)
Chloride: 103 mEq/L (ref 96–112)
Creat: 0.59 mg/dL (ref 0.50–1.10)
Potassium: 4.4 mEq/L (ref 3.5–5.3)

## 2013-11-28 LAB — C-REACTIVE PROTEIN: CRP: 2.7 mg/dL — ABNORMAL HIGH (ref <0.60)

## 2013-11-28 LAB — SEDIMENTATION RATE: Sed Rate: 53 mm/hr — ABNORMAL HIGH (ref 0–22)

## 2013-11-28 MED ORDER — CLOBETASOL PROPIONATE 0.05 % EX LOTN: 1.0000 "application " | TOPICAL_LOTION | Freq: Two times a day (BID) | CUTANEOUS | Status: DC

## 2013-11-28 MED ORDER — NORGESTIM-ETH ESTRAD TRIPHASIC 0.18/0.215/0.25 MG-35 MCG PO TABS: 1.0000 | ORAL_TABLET | Freq: Every day | ORAL | Status: DC

## 2013-11-28 MED ORDER — GABAPENTIN 300 MG PO CAPS: 300.0000 mg | ORAL_CAPSULE | Freq: Every day | ORAL | Status: DC

## 2013-11-28 MED ORDER — CARISOPRODOL 350 MG PO TABS: 350.0000 mg | ORAL_TABLET | Freq: Four times a day (QID) | ORAL | Status: DC | PRN

## 2013-11-28 MED ORDER — TERBINAFINE HCL 250 MG PO TABS: 250.0000 mg | ORAL_TABLET | Freq: Every day | ORAL | Status: DC

## 2013-11-28 MED ORDER — OMEPRAZOLE 40 MG PO CPDR: 40.0000 mg | DELAYED_RELEASE_CAPSULE | Freq: Every day | ORAL | Status: DC

## 2013-11-28 NOTE — Patient Instructions (Addendum)
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 The chronic hand and nail problems that you have lived with your whole life.  Your consistently elevated inflammatory markers are definitely consistent with this and let us know that SOMETHING is going on in your body other than just fibromyalgia.  Please ask Dr. Nickola Major to send me a copy of her note.  We will not change your NSAID (diclofenac) at this point since you are seeing her next month.   Start the gabapentin every night. If you are doing ok, then increase it to a morning and night dose in 7-10d.  If still doing ok w/ no side effects, you can add in a mid-day dose as well.  In several weeks after starting on the gabapentin, you can fill the soma and try that on an as needed basis for muscle relaxant.  General measures for eczema - In clinical experience, avoidance of irritants or exacerbating factors is beneficial for most patients with dyshidrotic eczema. General skin care measures aimed at reducing skin irritation and restoring the skin barrier include:  Using lukewarm water and soap-free cleansers to wash hands  Drying hands thoroughly after washing  Applying emollients (eg, petroleum jelly) immediately after hand drying and as often as possible  Wearing cotton gloves under vinyl or other nonlatex gloves when performing wet work  Removing rings and watches and bracelets before wet work  Wearing protective gloves in cold weather  Wearing task-specific gloves for frictional exposures (eg, gardening, carpentry)  Avoiding exposure to irritants (eg, detergents, solvents, hair lotions or dyes, acidic foods [eg, citrus fruit])  In clinical practice, astringent solutions such as aluminum subacetate (Burow's solution) or witch hazel are used for wet, weeping skin. Hands or feet are soaked in the solution for 15 minutes two to four times per day.  Diet for Gastroesophageal Reflux Disease, Adult Reflux (acid reflux) is when acid from your  stomach flows up into the esophagus. When acid comes in contact with the esophagus, the acid causes irritation and soreness (inflammation) in the esophagus. When reflux happens often or so severely that it causes damage to the esophagus, it is called gastroesophageal reflux disease (GERD). Nutrition therapy can help ease the discomfort of GERD. FOODS OR DRINKS TO AVOID OR LIMIT  Smoking or chewing tobacco. Nicotine is one of the most potent stimulants to acid production in the gastrointestinal tract.  Caffeinated and decaffeinated coffee and black tea.  Regular or low-calorie carbonated beverages or energy drinks (caffeine-free carbonated beverages are allowed).   Strong spices, such as black pepper, white pepper, red pepper, cayenne, curry powder, and chili powder.  Peppermint or spearmint.  Chocolate.  High-fat foods, including meats and fried foods. Extra added fats including oils, butter, salad dressings, and nuts. Limit these to less than 8 tsp per day.  Fruits and vegetables if they are not tolerated, such as citrus fruits or tomatoes.  Alcohol.  Any food that seems to aggravate your condition. If you have questions regarding your diet, call your caregiver or a registered dietitian. OTHER THINGS THAT MAY HELP GERD INCLUDE:   Eating your meals slowly, in a relaxed setting.  Eating 5 to 6 small meals per day instead of 3 large meals.  Eliminating food for a period of time if it causes distress.  Not lying down until 3 hours after eating a meal.  Keeping the head of your bed raised 6 to 9 inches (15 to 23 cm) by using a foam wedge or blocks under  the legs of the bed. Lying flat may make symptoms worse.  Being physically active. Weight loss may be helpful in reducing reflux in overweight or obese adults.  Wear loose fitting clothing EXAMPLE MEAL PLAN This meal plan is approximately 2,000 calories based on https://www.[facility].org/ meal planning guidelines. Breakfast   cup cooked  oatmeal.  1 cup strawberries.  1 cup low-fat milk.  1 oz almonds. Snack  1 cup cucumber slices.  6 oz yogurt (made from low-fat or fat-free milk). Lunch  2 slice whole-wheat bread.  2 oz sliced [place].  2 tsp mayonnaise.  1 cup blueberries.  1 cup snap peas. Snack  6 whole-wheat crackers.  1 oz string cheese. Dinner   cup brown rice.  1 cup mixed veggies.  1 tsp olive oil.  3 oz grilled fish. Document Released: 12/11/2005 Document Revised: 03/04/2012 Document Reviewed: 10/27/2011 [facility] Patient Information 2014 [place], [place].

## 2013-11-28 NOTE — Progress Notes (Signed)
Subjective:    Patient ID: Dawn Dunn, female    DOB: July 30, 1986, 27 y.o.   MRN: [id number] Chief Complaint  Patient presents with  . blood work    derm referred patient for cmet  . Gastrophageal Reflux    HPI  Saw derm PA who told her but maybe mild psoriasis or eczema but very mild. Was diagnosed w/ nail fungus by clipping so going to start antifungal medication but has not been rx'ed yet - she needed to get a cmp before they would rx her lamisil.  Saw rheumatologist twice - had sonogram of hand done which was normal. She responded better to the diclofenac gel but not to the oral diclofenac (taken off of meloxicam.)  Has follow-up with rheum next month - seeing Dr. Zenovia Jordan. Pt reports that Dr. Nickola Major thinks this could be psoriatic arthritis as well but wanted derm to confirm skin diagnosis but derm wouldn't diagnose her definitively w/ anything.  Started having trouble affording medication so went off of elavil and cymbalta. Skelaxin helping somewhat- helps w/ menstrual cramps more, back pain a little, hand pain not at all. Mood is unchanged. Has never tried gabapentin or lyrica. Her father is on gabapentin.  Broke up with boyfriend so went off of birth control (and last boyfriend had a vasectomy).  Would like to have repeat pap smear this yr.  Vomiting for the past month - can't eat fried food, can't keep coffee down, acidic foods, tomato products. Is having indigestion and heartburn.  Does ok on chicken noodle soup and gatorade.  Lost 10 lbs w/ this.  Went off her prilosec due to cost but thinks that might have been a mistake. Was seen in [place] [facility] - given a L of IVF and had nml cbc/cmp/ua and neg abd xrays (brought in records).   Past Medical History  Diagnosis Date  . Kidney stones   . Anemia   . Fibromyalgia   . Mild cervical dysplasia   . Asthma   . Allergy   . Depression    Current Outpatient Prescriptions on File Prior to Visit  Medication Sig Dispense Refill    . amitriptyline (ELAVIL) 100 MG tablet Take 1 tablet (100 mg total) by mouth at bedtime.  90 tablet  1  . butalbital-acetaminophen-caffeine (FIORICET/CODEINE) 50-325-40-30 MG per capsule Take 1 capsule by mouth every 4 (four) hours as needed for headache.  20 capsule  0  . cetirizine (ZYRTEC) 10 MG chewable tablet Chew 10 mg by mouth daily.      . cholecalciferol (VITAMIN D) 1000 UNITS tablet Take 1,000 Units by mouth daily.      . fluticasone (FLONASE) 50 MCG/ACT nasal spray Place 2 sprays into the nose daily.  16 g  11  . HYDROcodone-acetaminophen (NORCO/VICODIN) 5-325 MG per tablet Take 1 tablet by mouth every 6 (six) hours as needed for pain.  30 tablet  0  . metaxalone (SKELAXIN) 800 MG tablet Take 1 tablet (800 mg total) by mouth 3 (three) times daily.  90 tablet  0  . omeprazole (PRILOSEC) 40 MG capsule Take 1 capsule (40 mg total) by mouth daily.  90 capsule  1  . promethazine (PHENERGAN) 25 MG tablet Take 1 tablet (25 mg total) by mouth every 8 (eight) hours as needed for nausea.  20 tablet  0  . triamcinolone cream (KENALOG) 0.1 % Apply topically 2 (two) times daily.  454 g  3  . DULoxetine (CYMBALTA) 60 MG capsule Take 1 capsule (  60 mg total) by mouth daily.  90 capsule  1  . meloxicam (MOBIC) 15 MG tablet Take 1 tablet (15 mg total) by mouth daily as needed for pain.  30 tablet  1  . Norgestimate-Ethinyl Estradiol Triphasic (TRI-SPRINTEC) 0.18/0.215/0.25 MG-35 MCG tablet Take 1 tablet by mouth daily.  1 Package  11  . predniSONE (DELTASONE) 20 MG tablet 3-2-2-1-1-1 daily with food  10 tablet  0   No current facility-administered medications on file prior to visit.   Allergies  Allergen Reactions  . Ketorolac Tromethamine Other (See Comments)    Pain at the injection site  . Hydrocodone-Acetaminophen Rash    Only allergic to [employer]; Hydrocodone tablets are ok      Review of Systems  Constitutional: Positive for fatigue and unexpected weight change. Negative for fever  and chills.  Gastrointestinal: Positive for nausea, vomiting and abdominal pain. Negative for abdominal distention.  Genitourinary: Negative for urgency, frequency, decreased urine volume and difficulty urinating.  Musculoskeletal: Positive for arthralgias, back pain, joint swelling, myalgias and neck stiffness. Negative for gait problem.  Skin: Positive for color change, rash and wound. Negative for pallor.  Neurological: Positive for weakness. Negative for numbness.  Hematological: Negative for adenopathy. Does not bruise/bleed easily.  Psychiatric/Behavioral: Positive for sleep disturbance, dysphoric mood and decreased concentration. Negative for behavioral problems and agitation. The patient is not nervous/anxious.       BP 117/73  Pulse 92  Temp(Src) 98.5 F (36.9 C) (Oral)  Resp 16  Ht 5\' 4"  (1.626 m)  Wt 223 lb (101.152 kg)  BMI 38.26 kg/m2  SpO2 98%  LMP 11/26/2013 Objective:   Physical Exam  Constitutional: She is oriented to person, place, and time. She appears well-developed and well-nourished. No distress.  HENT:  Head: Normocephalic and atraumatic.  Right Ear: External ear normal.  Eyes: Conjunctivae are normal. No scleral icterus.  Pulmonary/Chest: Effort normal.  Neurological: She is alert and oriented to person, place, and time.  Skin: Skin is warm and dry. She is not diaphoretic. No erythema.  All nails w/ pitting, some separating from nail bed.  Psychiatric: She has a normal mood and affect. Her behavior is normal.          Assessment & Plan:  Need for prophylactic vaccination and inoculation against influenza - Plan: Flu Vaccine QUAD 36+ mos IM  Onychomycosis - Plan: Comprehensive metabolic panel - diagnosed by derm with nail clipping so will start lamisil. Check cmp today and again at f/u  Encounter for long-term (current) use of other medications - Plan: Comprehensive metabolic panel  Family planning - Plan: Norgestimate-Ethinyl Estradiol Triphasic  (TRI-SPRINTEC) 0.18/0.215/0.25 MG-35 MCG tablet - not using currently as not sexually active - will restart if she decides to become involved again.  Arthralgia - Plan: Sedimentation Rate, C-reactive protein - I think that it is highly likely that pt does have psoriatic arthritis - her chronic skin/nail problems, persistently elevated inflammatory markers, and episodes of severe synovitis seems very consistent.  Cont to f/u w/ rheumatology for eval - consider trial of other nsaid that diclofenac or meloxicam -  Consider trial of nabumetone. Start gabapentin 300 qhs and wean up to tid  Myalgia and myositis - Plan: Sedimentation Rate, C-reactive protein - try soma rather than skelaxin  Unspecified gastritis and gastroduodenitis without mention of hemorrhage - Plan: H. pylori antibody, IgG  GERD (gastroesophageal reflux disease) - restart ppi.  HM - pt would like to cont with yearly paps which is fine -  esp since she is not married and so occ changes partners so sched for CPP w/ pap at f/u  Meds ordered this encounter  Medications  . terbinafine (LAMISIL) 250 MG tablet    Sig: Take 1 tablet (250 mg total) by mouth daily.    Dispense:  30 tablet    Refill:  5  . Clobetasol Propionate 0.05 % lotion    Sig: Apply 1 application topically 2 (two) times daily. To hands and feet    Dispense:  118 mL    Refill:  3  . omeprazole (PRILOSEC) 40 MG capsule    Sig: Take 1 capsule (40 mg total) by mouth daily.    Dispense:  90 capsule    Refill:  1    Order Specific Question:  Supervising Provider    Answer:  DOOLITTLE, ROBERT P [[id number]]  . Norgestimate-Ethinyl Estradiol Triphasic (TRI-SPRINTEC) 0.18/0.215/0.25 MG-35 MCG tablet    Sig: Take 1 tablet by mouth daily.    Dispense:  1 Package    Refill:  11  . carisoprodol (SOMA) 350 MG tablet    Sig: Take 1 tablet (350 mg total) by mouth 4 (four) times daily as needed for muscle spasms.    Dispense:  60 tablet    Refill:  3  . gabapentin (NEURONTIN)  300 MG capsule    Sig: Take 1 capsule (300 mg total) by mouth at bedtime. Increase as instructed up to tid as tolerated.    Dispense:  90 capsule    Refill:  3  . diclofenac (VOLTAREN) 75 MG EC tablet    Sig:     Norberto Sorenson, MD MPH

## 2014-01-09 ENCOUNTER — Encounter: Payer: Self-pay | Admitting: Family Medicine

## 2014-01-09 ENCOUNTER — Ambulatory Visit (INDEPENDENT_AMBULATORY_CARE_PROVIDER_SITE_OTHER): Payer: BC Managed Care – PPO | Admitting: Family Medicine

## 2014-01-09 VITALS — BP 128/72 | HR 90 | Temp 98.7°F | Resp 16 | Ht 64.0 in | Wt 224.0 lb

## 2014-01-09 DIAGNOSIS — B373 Candidiasis of vulva and vagina: Secondary | ICD-10-CM

## 2014-01-09 DIAGNOSIS — B3731 Acute candidiasis of vulva and vagina: Secondary | ICD-10-CM

## 2014-01-09 DIAGNOSIS — Z0000 Encounter for general adult medical examination without abnormal findings: Secondary | ICD-10-CM

## 2014-01-09 LAB — COMPLETE METABOLIC PANEL WITH GFR
ALT: 12 U/L (ref 0–35)
AST: 13 U/L (ref 0–37)
Albumin: 4.1 g/dL (ref 3.5–5.2)
Alkaline Phosphatase: 120 U/L — ABNORMAL HIGH (ref 39–117)
BILIRUBIN TOTAL: 0.4 mg/dL (ref 0.3–1.2)
BUN: 10 mg/dL (ref 6–23)
CO2: 27 mEq/L (ref 19–32)
CREATININE: 0.62 mg/dL (ref 0.50–1.10)
Calcium: 9.2 mg/dL (ref 8.4–10.5)
Chloride: 104 mEq/L (ref 96–112)
GFR, Est African American: >89 mL/min
Glucose, Bld: 93 mg/dL (ref 70–99)
Potassium: 4.6 mEq/L (ref 3.5–5.3)
Sodium: 138 mEq/L (ref 135–145)
Total Protein: 6.7 g/dL (ref 6.0–8.3)

## 2014-01-09 LAB — POCT URINALYSIS DIPSTICK
BILIRUBIN UA: NEGATIVE
Glucose, UA: NEGATIVE
KETONES UA: NEGATIVE
Nitrite, UA: NEGATIVE
PH UA: 7
Protein, UA: NEGATIVE
Spec Grav, UA: 1.025
Urobilinogen, UA: 0.2

## 2014-01-09 LAB — POCT WET PREP WITH KOH
KOH Prep POC: POSITIVE
TRICHOMONAS UA: NEGATIVE
Yeast Wet Prep HPF POC: NEGATIVE

## 2014-01-09 MED ORDER — FLUCONAZOLE 150 MG PO TABS: 150.0000 mg | ORAL_TABLET | Freq: Once | ORAL | Status: DC

## 2014-01-09 NOTE — Progress Notes (Addendum)
Subjective:    Patient ID: Dawn Dunn, female    DOB: February 18, 1986, 28 y.o.   MRN: [id number] Chief Complaint  Patient presents with  . CPE    patient states she is doing well    HPI This chart was scribed for Aquilla Solian, by Lovena Le Day, Scribe. This patient was seen in room 26 and the patient's care was started at 11:16 AM.  HPI Comments: Dawn Dunn is a 28 y.o. female who presents to the [facility] for complete physical exam.  1.) We started [place] on gabapentin at her last visit to see if it would help w/ pain - she has titrated up to 300mg  tid and denies noticing any difference or improvement in her pain or her sleep. She denies any side effects, no fatigue or dizziness w/ this. Has not tried the soma yet.  2.) Onychomycosis. She has been taking x1 tablet Lamisil (250 mg) by mouth daily for just about a month. No changes in nails yet but no prob tol med.   3.) Her cholesterol was w/in normal limits when it was checked last year in previous visit. She is not fasting today.  All of her immunizations are UTD.  tdap 2012.  Had gardasil in 2008 and hep B in 1996 Last pap was 11/2012 but would like to do reg since not in a monogamous relationship yet.  Has started smoking again in the past yr- now > 1 ppd (had quit x 9 yrs), rare etoh.  Patient Active Problem List   Diagnosis Date Noted  . Arthralgia 11/28/2013  . Myalgia and myositis 11/28/2013  . Onychomycosis 11/28/2013  . GERD (gastroesophageal reflux disease) 11/28/2013  . Synovitis of hand 02/11/2013  . Fibromyalgia 01/17/2013  . Kidney stones 02/05/2012   Past Surgical History  Procedure Laterality Date  . Right ankle fracture    . Wisdom tooth extraction    . Cystoscopy    . Cervical cyrosurgery     Family History  Problem Relation Age of Onset  . Diabetes Father   . Depression Father   . Hyperlipidemia Father    History   Social History  . Marital Status: Single    Spouse  Name: N/A    Number of Children: N/A  . Years of Education: N/A   Occupational History  . Not on file.   Social History Main Topics  . Smoking status: Current Every Day Smoker -- 1.00 packs/day for 8 years    Last Attempt to Quit: 11/25/2012  . Smokeless tobacco: Never Used  . Alcohol Use: Yes     Comment: rarely  . Drug Use: No  . Sexual Activity: No   Other Topics Concern  . Not on file   Social History Narrative   Patient is an Water quality scientist. She exercises walking 1-2 times weekly for 30 minutes.   Allergies  Allergen Reactions  . Ketorolac Tromethamine Other (See Comments)    Pain at the injection site  . Hydrocodone-Acetaminophen Rash    Only allergic to [employer]; Hydrocodone tablets are ok    Review of Systems  Constitutional: Negative for fever and chills.  Respiratory: Negative for cough and shortness of breath.   Cardiovascular: Negative for chest pain.  Gastrointestinal: Negative for abdominal pain.  Musculoskeletal: Negative for back pain.  All other systems reviewed and are negative.      Objective:   Physical Exam  Nursing note and vitals reviewed. Constitutional: She is oriented to person,  place, and time. She appears well-developed and well-nourished. No distress.  HENT:  Head: Normocephalic and atraumatic.  Right Ear: Tympanic membrane, external ear and ear canal normal.  Left Ear: Tympanic membrane, external ear and ear canal normal.  Nose: Mucosal edema present. No rhinorrhea.  Mouth/Throat: Uvula is midline, oropharynx is clear and moist and mucous membranes are normal. No posterior oropharyngeal erythema.  Eyes: Conjunctivae and EOM are normal. Pupils are equal, round, and reactive to light. Right eye exhibits no discharge. Left eye exhibits no discharge. No scleral icterus.  Neck: Normal range of motion. Neck supple. No thyromegaly present.  Cardiovascular: Normal rate, regular rhythm, normal heart sounds and intact distal  pulses.   Pulmonary/Chest: Effort normal and breath sounds normal. No respiratory distress.  Abdominal: Soft. Bowel sounds are normal. There is no tenderness.  Genitourinary: Uterus normal. No breast swelling, tenderness, discharge or bleeding. Cervix exhibits no motion tenderness and no friability. Right adnexum displays no mass and no tenderness. Left adnexum displays no mass and no tenderness. Vaginal discharge found.  Musculoskeletal: Normal range of motion. She exhibits no edema.  Lymphadenopathy:    She has no cervical adenopathy.  Neurological: She is alert and oriented to person, place, and time. She has normal reflexes.  Skin: Skin is warm and dry. She is not diaphoretic. No erythema.  Psychiatric: She has a normal mood and affect. Her behavior is normal. Thought content normal.   Triage Vitals: BP 128/72  Pulse 90  Temp(Src) 98.7 F (37.1 C)  Resp 16  Ht 5\' 4"  (1.626 m)  Wt 224 lb (101.606 kg)  BMI 38.43 kg/m2  LMP 12/24/2013    Assessment & Plan:   Routine general medical examination at a health care facility - Plan: COMPLETE METABOLIC PANEL WITH GFR, Pap IG, CT/NG w/ reflex HPV when ASC-U, POCT urinalysis dipstick, POCT Wet Prep with KOH - cont annual paps per pt preference.  Yeast vaginitis  Onychomycosis - Recheck lfts since started on lamisil since last visit.  Chronic pain - Wean up on gabapentin to see if any relief. On 300mg  tid w/o sig relief so wean up to 600mg  qam, 300mg  w/ lunch, 600mg  w/ supper - if still no relief than would consider this MORE than adequate trial so pt will wean back down - discussed wean down to 300qam and 600 qhs x sev d, then 300 bid, then 300 qhs then stop.  Pt will cont to f/u w/ rheum Dr. Trudie Reed and will req records be sent here - strongly suspect pt has psoriatic arthritis due to persistent elev inflammatory markers and abnml chronic skin/nail rashes.  Meds ordered this encounter  Medications  . gabapentin (NEURONTIN) 300 MG capsule     Sig: Take 300 mg by mouth 3 (three) times daily. Increase as instructed up to tid as tolerated.  . fluconazole (DIFLUCAN) 150 MG tablet    Sig: Take 1 tablet (150 mg total) by mouth once. Repeat if needed    Dispense:  2 tablet    Refill:  0    I personally performed the services described in this documentation, which was scribed in my presence. The recorded information has been reviewed and considered, and addended by me as needed.  Delman Cheadle, MD MPH

## 2014-01-12 LAB — PAP IG, CT-NG, RFX HPV ASCU
Chlamydia Probe Amp: NEGATIVE
GC Probe Amp: NEGATIVE

## 2014-01-18 ENCOUNTER — Encounter: Payer: Self-pay | Admitting: Family Medicine

## 2014-01-21 ENCOUNTER — Encounter: Payer: Self-pay | Admitting: Family Medicine

## 2014-01-21 ENCOUNTER — Ambulatory Visit (INDEPENDENT_AMBULATORY_CARE_PROVIDER_SITE_OTHER): Payer: BC Managed Care – PPO | Admitting: Family Medicine

## 2014-01-21 VITALS — BP 106/74 | HR 87 | Temp 98.7°F | Resp 18 | Ht 64.5 in | Wt 227.0 lb

## 2014-01-21 DIAGNOSIS — L0390 Cellulitis, unspecified: Secondary | ICD-10-CM

## 2014-01-21 DIAGNOSIS — L0291 Cutaneous abscess, unspecified: Secondary | ICD-10-CM

## 2014-01-21 DIAGNOSIS — L409 Psoriasis, unspecified: Secondary | ICD-10-CM

## 2014-01-21 DIAGNOSIS — L408 Other psoriasis: Secondary | ICD-10-CM

## 2014-01-21 MED ORDER — CEPHALEXIN 500 MG PO CAPS: 500.0000 mg | ORAL_CAPSULE | Freq: Four times a day (QID) | ORAL | Status: DC

## 2014-01-21 MED ORDER — BETAMETHASONE VALERATE 0.1 % EX OINT: 1.0000 "application " | TOPICAL_OINTMENT | Freq: Two times a day (BID) | CUTANEOUS | Status: DC

## 2014-01-21 NOTE — Patient Instructions (Signed)
Psoriasis Psoriasis is a common, long-lasting (chronic) inflammation of the skin. It affects both men and women equally, of all ages and all races. Psoriasis cannot be passed from person to person (not contagious). Psoriasis varies from mild to very severe. When severe, it can greatly affect your quality of life. Psoriasis is an inflammatory disorder affecting the skin as well as other organs including the joints (causing an arthritis). With psoriasis, the skin sheds its top layer of cells more rapidly than it does in someone without psoriasis. CAUSES  The cause of psoriasis is largely unknown. Genetics, your immune system, and the environment seem to play a role in causing psoriasis. Factors that can make psoriasis worse include:  Damage or trauma to the skin, such as cuts, scrapes, and sunburn. This damage often causes new areas of psoriasis (lesions).  Winter dryness and lack of sunlight.  Medicines such as lithium, beta-blockers, antimalarial drugs, ACE inhibitors, nonsteroidal anti-inflammatory drugs (ibuprofen, aspirin), and terbinafine. Let your caregiver know if you are taking any of these drugs.  Alcohol. Excessive alcohol use should be avoided if you have psoriasis. Drinking large amounts of alcohol can affect:  How well your psoriasis treatment works.  How safe your psoriasis treatment is.  Smoking. If you smoke, ask your caregiver for help to quit.  Stress.  Bacterial or viral infections.  Arthritis. Arthritis associated with psoriasis (psoriatic arthritis) affects less than 10% of patients with psoriasis. The arthritic intensity does not always match the skin psoriasis intensity. It is important to let your caregiver know if your joints hurt or if they are stiff. SYMPTOMS  The most common form of psoriasis begins with little red bumps that gradually become larger. The bumps begin to form scales that flake off easily. The lower layers of scales stick together. When these scales  are scratched or removed, the underlying skin is tender and bleeds easily. These areas then grow in size and may become large. Psoriasis often creates a rash that looks the same on both sides of the body (symmetrical). It often affects the elbows, knees, groin, genitals, arms, legs, scalp, and nails. Affected nails often have pitting, loosen, thicken, crumble, and are difficult to treat.  "Inverse psoriasis"occurs in the armpits, under breasts, in skin folds, and around the groin, buttocks, and genitals.  "Guttate psoriasis" generally occurs in children and young adults following a recent sore throat (strep throat). It begins with many small, red, scaly spots on the skin. It clears spontaneously in weeks or a few months without treatment. DIAGNOSIS  Psoriasis is diagnosed by physical exam. A tissue sample (biopsy) may also be taken. TREATMENT The treatment of psoriasis depends on your age, health, and living conditions.  Steroid (cortisone) creams, lotions, and ointments may be used. These treatments are associated with thinning of the skin, blood vessels that get larger (dilated), loss of skin pigmentation, and easy bruising. It is important to use these steroids as directed by your caregiver. Only treat the affected areas and not the normal, unaffected skin. People on long-term steroid treatment should wear a medical alert bracelet. Injections may be used in areas that are difficult to treat.  Scalp treatments are available as shampoos, solutions, sprays, foams, and oils. Avoid scratching the scalp and picking at the scales.  Anthralin medicine works well on areas that are difficult to treat. However, it stains clothes and skin and may cause temporary irritation.  Synthetic vitamin D (calcipotriene)can be used on small areas. It is available by prescription. The forms   of synthetic vitamin D available in health food stores do not help with psoriasis.  Coal tarsare available in various strengths  for psoriasis that is difficult to treat. They are one of the longest used treatments for difficult to treat psoriasis. However, they are messy to use.  Light therapy (UV therapy) can be carefully and professionally monitored in a dermatologist's office. Careful sunbathing is helpful for many people as directed by your caregiver. The exposure should be just long enough to cause a mild redness (erythema) of your skin. Avoid sunburn as this may make the condition worse. Sunscreen (SPF of 30 or higher) should be used to protect against sunburn. Cataracts, wrinkles, and skin aging are some of the harmful side effects of light therapy.  If creams (topical medicines) fail, there are several other options for systemic or oral medicines your caregiver can suggest. Psoriasis can sometimes be very difficult to treat. It can come and go. It is necessary to follow up with your caregiver regularly if your psoriasis is difficult to treat. Usually, with persistence you can get a good amount of relief. Maintaining consistent care is important. Do not change caregivers just because you do not see immediate results. It may take several trials to find the right combination of treatment for you. PREVENTING FLARE-UPS  Wear gloves while you wash dishes, while cleaning, and when you are outside in the cold.  If you have radiators, place a bowl of water or damp towel on the radiator. This will help put water back in the air. You can also use a humidifier to keep the air moist. Try to keep the humidity at about 60% in your home.  Apply moisturizer while your skin is still damp from bathing or showering. This traps water in the skin.  Avoid long, hot baths or showers. Keep soap use to a minimum. Soaps dry out the skin and wash away the protective oils. Use a fragrance free, dye free soap.  Drink enough water and fluids to keep your urine clear or pale yellow. Not drinking enough water depletes your skin's water  supply.  Turn off the heat at night and keep it low during the day. Cool air is less drying. SEEK MEDICAL CARE IF:  You have increasing pain in the affected areas.  You have uncontrolled bleeding in the affected areas.  You have increasing redness or warmth in the affected areas.  You start to have pain or stiffness in your joints.  You start feeling depressed about your condition.  You have a fever. Document Released: 12/08/2000 Document Revised: 03/04/2012 Document Reviewed: 06/05/2011 [facility] Patient Information 2014 [place], [place]. Cellulitis Cellulitis is an infection of the skin and the tissue beneath it. The infected area is usually red and tender. Cellulitis occurs most often in the arms and lower legs.  CAUSES  Cellulitis is caused by bacteria that enter the skin through cracks or cuts in the skin. The most common types of bacteria that cause cellulitis are Staphylococcus and Streptococcus. SYMPTOMS   Redness and warmth.  Swelling.  Tenderness or pain.  Fever. DIAGNOSIS  Your caregiver can usually determine what is wrong based on a physical exam. Blood tests may also be done. TREATMENT  Treatment usually involves taking an antibiotic medicine. HOME CARE INSTRUCTIONS   Take your antibiotics as directed. Finish them even if you start to feel better.  Keep the infected arm or leg elevated to reduce swelling.  Apply a warm cloth to the affected area up to 4  times per day to relieve pain.  Only take over-the-counter or prescription medicines for pain, discomfort, or fever as directed by your caregiver.  Keep all follow-up appointments as directed by your caregiver. SEEK MEDICAL CARE IF:   You notice red streaks coming from the infected area.  Your red area gets larger or turns dark in color.  Your bone or joint underneath the infected area becomes painful after the skin has healed.  Your infection returns in the same area or another area.  You notice a  swollen bump in the infected area.  You develop new symptoms. SEEK IMMEDIATE MEDICAL CARE IF:   You have a fever.  You feel very sleepy.  You develop vomiting or diarrhea.  You have a general ill feeling (malaise) with muscle aches and pains. MAKE SURE YOU:   Understand these instructions.  Will watch your condition.  Will get help right away if you are not doing well or get worse. Document Released: 09/20/2005 Document Revised: 06/11/2012 Document Reviewed: 02/26/2012 [facility] Patient Information 2014 [place].

## 2014-01-21 NOTE — Progress Notes (Signed)
This chart was scribed for Delman Cheadle, MD by Einar Pheasant, ED Scribe. This patient was seen in room 10 and the patient's care was started at 7:39 PM. Subjective:    Patient ID: Dawn Dunn, female    DOB: 12/10/1986, 28 y.o.   MRN: [id number]  Chief Complaint  Patient presents with  . Rash    both hands    HPI HPI Comments: Dawn Dunn is a 28 y.o. female who presents to [facility] complaining of a rash on both hands that 3 days ago. Pt had been appling Clobetasol to her hands regularly for psoriasis. Helped the palms.  However, she states that the steroid made her skin on the back of her hands crack and begin peeling. She reports using Aveeno to alleviate her symptoms but it did not work. It has become progressively red, hot, and tender.  She emailed into MyChart and I had her come in for eval.    Past Medical History  Diagnosis Date  . Kidney stones   . Anemia   . Fibromyalgia   . Mild cervical dysplasia   . Asthma   . Allergy   . Depression    Allergies  Allergen Reactions  . Ketorolac Tromethamine Other (See Comments)    Pain at the injection site  . Hydrocodone-Acetaminophen Rash    Only allergic to [employer]; Hydrocodone tablets are ok   Current Outpatient Prescriptions on File Prior to Visit  Medication Sig Dispense Refill  . butalbital-acetaminophen-caffeine (FIORICET/CODEINE) 50-325-40-30 MG per capsule Take 1 capsule by mouth every 4 (four) hours as needed for headache.  20 capsule  0  . carisoprodol (SOMA) 350 MG tablet Take 1 tablet (350 mg total) by mouth 4 (four) times daily as needed for muscle spasms.  60 tablet  3  . cetirizine (ZYRTEC) 10 MG chewable tablet Chew 10 mg by mouth daily.      . cholecalciferol (VITAMIN D) 1000 UNITS tablet Take 1,000 Units by mouth daily.      . Clobetasol Propionate 0.05 % lotion Apply 1 application topically 2 (two) times daily. To hands and feet  118 mL  3  . gabapentin (NEURONTIN) 300 MG  capsule Take 300 mg by mouth 3 (three) times daily. Increase as instructed up to tid as tolerated.      Marland Kitchen omeprazole (PRILOSEC) 40 MG capsule Take 1 capsule (40 mg total) by mouth daily.  90 capsule  1  . terbinafine (LAMISIL) 250 MG tablet Take 1 tablet (250 mg total) by mouth daily.  30 tablet  5   No current facility-administered medications on file prior to visit.    Review of Systems  Constitutional: Negative for fever, chills and diaphoresis.  Musculoskeletal: Positive for arthralgias, back pain and myalgias. Negative for gait problem and joint swelling.  Skin: Positive for color change and rash. Negative for pallor and wound.  Hematological: Negative for adenopathy. Does not bruise/bleed easily.  Psychiatric/Behavioral: Positive for sleep disturbance.      BP 106/74  Pulse 87  Temp(Src) 98.7 F (37.1 C) (Oral)  Resp 18  Ht 5' 4.5" (1.638 m)  Wt 227 lb (102.967 kg)  BMI 38.38 kg/m2  SpO2 100%  LMP 12/24/2013 Objective:   Physical Exam  Nursing note and vitals reviewed. Constitutional: She appears well-developed and well-nourished. No distress.  HENT:  Head: Normocephalic and atraumatic.  Eyes: Conjunctivae are normal. Right eye exhibits no discharge. Left eye exhibits no discharge.  Neck: Neck supple.  Cardiovascular:  Normal rate, regular rhythm and normal heart sounds.  Exam reveals no gallop and no friction rub.   No murmur heard. Pulmonary/Chest: Effort normal and breath sounds normal. No respiratory distress.  Abdominal: Soft. She exhibits no distension. There is no tenderness.  Musculoskeletal: She exhibits no edema and no tenderness.  Neurological: She is alert.  Skin: Rash noted. Rash is macular.  Dorsal aspect of both hands w/ macular erythemous scaling cracking rash. Scale hyperpigmented brown spots. Erythema is blanchable, tender, and warm to the touch. Finger nails with pitting, cracking, separating from skin.  Psychiatric: She has a normal mood and affect.  Her behavior is normal. Thought content normal.      Assessment & Plan:  7:47 PM-  Psoriasis - worsening w/ clobestasol lotion - try switching to betamethasone ointment mixed w/ petroleum  Cellulitis - start keflex and advised pt to apply Vaseline on the back of her hand.  Hold off on any steroid therapy for several d until improvement from cellulitis seen.  Pt advised of plan for treatment and pt agrees.  Meds ordered this encounter  Medications  . cephALEXin (KEFLEX) 500 MG capsule    Sig: Take 1 capsule (500 mg total) by mouth 4 (four) times daily.    Dispense:  40 capsule    Refill:  0  . betamethasone valerate ointment (VALISONE) 0.1 %    Sig: Apply 1 application topically 2 (two) times daily.    Dispense:  90 g    Refill:  1    Mix in 1:1 ratio with petroleum ointment/jelly.    I personally performed the services described in this documentation, which was scribed in my presence. The recorded information has been reviewed and considered, and addended by me as needed.  Delman Cheadle, MD MPH

## 2014-03-09 ENCOUNTER — Encounter: Payer: Self-pay | Admitting: Family Medicine

## 2014-03-10 MED ORDER — FLUTICASONE PROPIONATE 50 MCG/ACT NA SUSP: 2.0000 | Freq: Every day | NASAL | Status: AC

## 2014-03-10 NOTE — Telephone Encounter (Signed)
Changed pharmacy to [facility] on [address]. Pt has not had Flonase prescribed since July 2014. Ok to refill? I have pended med.

## 2014-03-18 ENCOUNTER — Ambulatory Visit (INDEPENDENT_AMBULATORY_CARE_PROVIDER_SITE_OTHER): Payer: BC Managed Care – PPO | Admitting: Family Medicine

## 2014-03-18 ENCOUNTER — Ambulatory Visit: Payer: BC Managed Care – PPO

## 2014-03-18 VITALS — BP 124/72 | HR 92 | Temp 98.1°F | Resp 16 | Ht 64.5 in | Wt 222.0 lb

## 2014-03-18 DIAGNOSIS — S6980XA Other specified injuries of unspecified wrist, hand and finger(s), initial encounter: Secondary | ICD-10-CM

## 2014-03-18 DIAGNOSIS — M79645 Pain in left finger(s): Secondary | ICD-10-CM

## 2014-03-18 DIAGNOSIS — S6990XA Unspecified injury of unspecified wrist, hand and finger(s), initial encounter: Secondary | ICD-10-CM

## 2014-03-18 DIAGNOSIS — M79609 Pain in unspecified limb: Secondary | ICD-10-CM

## 2014-03-18 MED ORDER — HYDROCODONE-ACETAMINOPHEN 5-325 MG PO TABS: 1.0000 | ORAL_TABLET | Freq: Three times a day (TID) | ORAL | Status: DC | PRN

## 2014-03-18 NOTE — Progress Notes (Signed)
   Subjective:    Patient ID: Dawn Dunn, female    DOB: 23-Jul-1986, 28 y.o.   MRN: [id number]  HPI   Dawn Dunn is a pleasant 28 yr old female here after injuring her Left 5th finger this afternoon.  She reports that the tip of the left pinky finger got slammed in the car door.  She has significant pain as well as some swelling and bruising at the distal finger.  The area bled a small amount as the skin was broken.  No numbness, tingling.  No other fingers were injured. She otherwise feels well.  She is Right hand dominant   Review of Systems  Constitutional: Negative for fever and chills.  Respiratory: Negative.   Cardiovascular: Negative.   Musculoskeletal: Positive for arthralgias and joint swelling.  Skin: Positive for wound.  Neurological: Negative for numbness.       Objective:   Physical Exam  Vitals reviewed. Constitutional: She is oriented to person, place, and time. She appears well-developed and well-nourished. No distress.  HENT:  Head: Normocephalic and atraumatic.  Eyes: Conjunctivae are normal. No scleral icterus.  Pulmonary/Chest: Effort normal.  Musculoskeletal:       Left hand: She exhibits tenderness (5th finger) and bony tenderness (5th finger). She exhibits normal range of motion, normal capillary refill and no deformity. Normal sensation noted.       Hands: The distal phalanx of the Left 5th finger is swollen and tender; there is a small subungual hematoma involving approx 20% of the nail; there is a tiny laceration at the proximal nail fold that is hemostatic and does not require repair  Neurological: She is alert and oriented to person, place, and time.  Skin: Skin is warm and dry.  Psychiatric: She has a normal mood and affect. Her behavior is normal.     [facility] reading (PRIMARY) by  Dr. Linna Darner - no fracture      Assessment & Plan:  Finger injury - Plan: DG Finger Little Left  Finger pain, left - Plan: DG Finger Little Left   Dawn Dunn is a  pleasant 28 yr old female here after slamming her finger in the car door.  There is no definite fracture on xray today.  I have placed her in a fold over splint - encouraged her to wear this until pain free.  Norco prn pain (pt is already on Celebrex daily; she lists an allergy to Lortab elixir only - can tolerated Norco tablets.)  Frequent ice and elevation over the next 48 hours. Expect significant improvement in the next week.  If worsening or not improving, to RTC .   Alonza Smoker MHS, PA-C [facility] 3/25/20154:02 PM

## 2014-03-18 NOTE — Patient Instructions (Signed)
Your xrays are negative for a fracture.  Wear the splint until you are pain free.  Elevate and ice frequently over these next 48 hours.  Use the Norco if needed for pain.  If you are worsening or not improving, please let us know.  I expect that within about a week you should be significantly improved

## 2014-03-18 NOTE — Progress Notes (Signed)
X-rays reviewed and no fracture seen. Discussed history, exam, and plan with [facility]. Agree with plan.

## 2014-03-30 ENCOUNTER — Ambulatory Visit (INDEPENDENT_AMBULATORY_CARE_PROVIDER_SITE_OTHER): Payer: BC Managed Care – PPO | Admitting: Emergency Medicine

## 2014-03-30 VITALS — BP 126/78 | HR 101 | Temp 98.2°F | Resp 16 | Ht 64.5 in | Wt 223.0 lb

## 2014-03-30 DIAGNOSIS — H1030 Unspecified acute conjunctivitis, unspecified eye: Secondary | ICD-10-CM

## 2014-03-30 DIAGNOSIS — J209 Acute bronchitis, unspecified: Secondary | ICD-10-CM

## 2014-03-30 DIAGNOSIS — J018 Other acute sinusitis: Secondary | ICD-10-CM

## 2014-03-30 MED ORDER — CIPROFLOXACIN HCL 0.3 % OP SOLN: 1.0000 [drp] | OPHTHALMIC | Status: DC

## 2014-03-30 MED ORDER — AMOXICILLIN-POT CLAVULANATE 875-125 MG PO TABS: 1.0000 | ORAL_TABLET | Freq: Two times a day (BID) | ORAL | Status: DC

## 2014-03-30 MED ORDER — PSEUDOEPHEDRINE-GUAIFENESIN ER 60-600 MG PO TB12: 1.0000 | ORAL_TABLET | Freq: Two times a day (BID) | ORAL | Status: DC

## 2014-03-30 MED ORDER — PROMETHAZINE-CODEINE 6.25-10 MG/5ML PO SYRP: 5.0000 mL | ORAL_SOLUTION | Freq: Four times a day (QID) | ORAL | Status: DC | PRN

## 2014-03-30 NOTE — Progress Notes (Signed)
[facility] [address], [place] [address] [phone]  Date:  03/30/2014   Name:  Dawn Dunn   DOB:  December 22, 1986   MRN:  [id number]  PCP:  Delman Cheadle, MD    Chief Complaint: Cough, congestion and eye redness   History of Present Illness:  Dawn Dunn is a 28 y.o. very pleasant female patient who presents with the following:  Ill since last week with nasal congestion and purulent drainage and post nasal drip.  Sudden left eye injection and drainage.  No pain.  No visual symptoms  Has a sore throat.  No fever or chills.  Cough productive mucoid sputum.  Some wheezing.  No shortness of breath.  No nausea or vomiting.  No stool change.  No rash.  No improvement with over the counter medications or other home remedies. Denies other complaint or health concern today.   Patient Active Problem List   Diagnosis Date Noted  . Arthralgia 11/28/2013  . Myalgia and myositis 11/28/2013  . Onychomycosis 11/28/2013  . GERD (gastroesophageal reflux disease) 11/28/2013  . Synovitis of hand 02/11/2013  . Fibromyalgia 01/17/2013  . Kidney stones 02/05/2012    Past Medical History  Diagnosis Date  . Kidney stones   . Anemia   . Fibromyalgia   . Mild cervical dysplasia   . Asthma   . Allergy   . Depression     Past Surgical History  Procedure Laterality Date  . Right ankle fracture    . Wisdom tooth extraction    . Cystoscopy    . Cervical cyrosurgery      History  Substance Use Topics  . Smoking status: Current Every Day Smoker -- 1.00 packs/day for 8 years    Last Attempt to Quit: 11/25/2012  . Smokeless tobacco: Never Used  . Alcohol Use: Yes     Comment: rarely    Family History  Problem Relation Age of Onset  . Diabetes Father   . Depression Father   . Hyperlipidemia Father     Allergies  Allergen Reactions  . Ketorolac Tromethamine Other (See Comments)    Pain at the injection site  . Hydrocodone-Acetaminophen Rash    Only allergic  to [employer]; Hydrocodone tablets are ok    Medication list has been reviewed and updated.  Current Outpatient Prescriptions on File Prior to Visit  Medication Sig Dispense Refill  . betamethasone valerate ointment (VALISONE) 0.1 % Apply 1 application topically 2 (two) times daily.  90 g  1  . carisoprodol (SOMA) 350 MG tablet Take 1 tablet (350 mg total) by mouth 4 (four) times daily as needed for muscle spasms.  60 tablet  3  . celecoxib (CELEBREX) 200 MG capsule Take 200 mg by mouth daily.      . cholecalciferol (VITAMIN D) 1000 UNITS tablet Take 1,000 Units by mouth daily.      . fluticasone (FLONASE) 50 MCG/ACT nasal spray Place 2 sprays into both nostrils daily.  16 g  6  . omeprazole (PRILOSEC) 40 MG capsule Take 1 capsule (40 mg total) by mouth daily.  90 capsule  1  . terbinafine (LAMISIL) 250 MG tablet Take 1 tablet (250 mg total) by mouth daily.  30 tablet  5  . HYDROcodone-acetaminophen (NORCO) 5-325 MG per tablet Take 1 tablet by mouth every 8 (eight) hours as needed.  15 tablet  0   No current facility-administered medications on file prior to visit.    Review of Systems:  As per HPI, otherwise negative.    Physical Examination: Filed Vitals:   03/30/14 1255  BP: 126/78  Pulse: 101  Temp: 98.2 F (36.8 C)  Resp: 16   Filed Vitals:   03/30/14 1255  Height: 5' 4.5" (1.638 m)  Weight: 223 lb (101.152 kg)   Body mass index is 37.7 kg/(m^2). Ideal Body Weight: Weight in (lb) to have BMI = 25: 147.6  GEN: WDWN, NAD, Non-toxic, A & O x 3 HEENT: Atraumatic, Normocephalic. Neck supple. No masses, No LAD. Ears and Nose: No external deformity. CV: RRR, No M/G/R. No JVD. No thrill. No extra heart sounds. PULM: CTA B, no wheezes, crackles, rhonchi. No retractions. No resp. distress. No accessory muscle use. ABD: S, NT, ND, +BS. No rebound. No HSM. EXTR: No c/c/e NEURO Normal gait.  PSYCH: Normally interactive. Conversant. Not depressed or anxious appearing.  Calm  demeanor.    Assessment and Plan: Conjunctivitis cipro optic Sinusitis augmentin mucinex d Bronchitis Use MDI Phen with cod  Signed,  Ellison Carwin, MD

## 2014-03-30 NOTE — Patient Instructions (Signed)

## 2014-06-26 ENCOUNTER — Ambulatory Visit (INDEPENDENT_AMBULATORY_CARE_PROVIDER_SITE_OTHER): Payer: BC Managed Care – PPO | Admitting: Family Medicine

## 2014-06-26 ENCOUNTER — Encounter: Payer: Self-pay | Admitting: Family Medicine

## 2014-06-26 VITALS — BP 124/80 | HR 92 | Temp 99.0°F | Resp 16 | Ht 65.0 in | Wt 226.0 lb

## 2014-06-26 DIAGNOSIS — R05 Cough: Secondary | ICD-10-CM

## 2014-06-26 DIAGNOSIS — L409 Psoriasis, unspecified: Secondary | ICD-10-CM

## 2014-06-26 DIAGNOSIS — J3081 Allergic rhinitis due to animal (cat) (dog) hair and dander: Secondary | ICD-10-CM

## 2014-06-26 DIAGNOSIS — L4050 Arthropathic psoriasis, unspecified: Secondary | ICD-10-CM

## 2014-06-26 DIAGNOSIS — R3915 Urgency of urination: Secondary | ICD-10-CM

## 2014-06-26 DIAGNOSIS — B079 Viral wart, unspecified: Secondary | ICD-10-CM

## 2014-06-26 DIAGNOSIS — R059 Cough, unspecified: Secondary | ICD-10-CM

## 2014-06-26 DIAGNOSIS — L408 Other psoriasis: Secondary | ICD-10-CM

## 2014-06-26 LAB — POCT UA - MICROSCOPIC ONLY
Bacteria, U Microscopic: NEGATIVE
Casts, Ur, LPF, POC: NEGATIVE
Crystals, Ur, HPF, POC: NEGATIVE
MUCUS UA: NEGATIVE
RBC, URINE, MICROSCOPIC: NEGATIVE
WBC, UR, HPF, POC: NEGATIVE
Yeast, UA: NEGATIVE

## 2014-06-26 LAB — POCT URINALYSIS DIPSTICK
Bilirubin, UA: NEGATIVE
Blood, UA: NEGATIVE
GLUCOSE UA: NEGATIVE
KETONES UA: NEGATIVE
Leukocytes, UA: NEGATIVE
Nitrite, UA: NEGATIVE
Protein, UA: NEGATIVE
SPEC GRAV UA: 1.015
Urobilinogen, UA: 0.2
pH, UA: 7

## 2014-06-26 MED ORDER — OMEPRAZOLE 40 MG PO CPDR: 40.0000 mg | DELAYED_RELEASE_CAPSULE | Freq: Every day | ORAL | Status: AC

## 2014-06-26 MED ORDER — HYDROCODONE-ACETAMINOPHEN 5-325 MG PO TABS: 1.0000 | ORAL_TABLET | Freq: Three times a day (TID) | ORAL | Status: DC | PRN

## 2014-06-26 MED ORDER — CARISOPRODOL 350 MG PO TABS: 350.0000 mg | ORAL_TABLET | Freq: Four times a day (QID) | ORAL | Status: DC | PRN

## 2014-06-26 MED ORDER — MONTELUKAST SODIUM 10 MG PO TABS: 10.0000 mg | ORAL_TABLET | Freq: Every day | ORAL | Status: DC

## 2014-06-26 MED ORDER — BETAMETHASONE VALERATE 0.1 % EX OINT
1.0000 | TOPICAL_OINTMENT | Freq: Two times a day (BID) | CUTANEOUS | Status: DC
Start: 2014-06-26 — End: 2016-05-11

## 2014-06-26 NOTE — Progress Notes (Signed)
Subjective:  This chart was scribed for Shawnee Knapp, MD,  by Stacy Gardner, Medical Scribe. The patient was seen in room Room/bed info not found and the patient's care was started at 4:29 PM.    Patient ID: Dawn Dunn, female    DOB: [birth year], 28 y.o.   MRN: [id number]  06/26/2014  Medication Refill and wart on right index finger   HPI HPI Comments: Dawn Dunn is a 28 y.o. female who arrives to the [facility] for a medical refill. She has a wart on her right index finger.  She was on betamethasone for what I presume to be psoriasis on her hands.  It helped the rash on her palm but it made the back of her hands begin to peel. Therefore I put her on betamethasone. She recently completed Lamisil for fungus on her toe nails. Pt was seen by Dr. Lenna Gilford to determines if she has psoriatic arthritis due to inflammation rashes, chronic skin rashes and nail changes. She reports her greater toe fell off and remains to be slightly lose.   Pt had a URI and mentions the productive cough did not resolve. She has clear sputum.  Pt got another cat although she had an allergy to cats. Denies fever, chills, ad SOB.  Pt was told that she has the option to get off of Celebrex so she discontinued taking it. She was recommended to go to PT.  She sprain her right ankle at work and wearing a ankle splint. Pt is still taking vitamin D. She reports her allegies have gotten worse and have tried Allegra and Flonase with moderate improvements. The hydrocodone relieved the pain when used.    Review of Systems  Constitutional: Positive for fatigue and unexpected weight change. Negative for fever and chills.  HENT: Positive for congestion, postnasal drip, rhinorrhea and sinus pressure.   Respiratory: Positive for cough. Negative for shortness of breath and wheezing.   Cardiovascular: Positive for leg swelling. Negative for chest pain and palpitations.  Gastrointestinal: Negative for vomiting.    Musculoskeletal: Positive for arthralgias, back pain, joint swelling and myalgias. Negative for gait problem.  Skin: Positive for color change, rash and wound. Negative for pallor.  Allergic/Immunologic: Positive for environmental allergies. Negative for immunocompromised state.    Past Medical History  Diagnosis Date  . Kidney stones   . Anemia   . Fibromyalgia   . Mild cervical dysplasia   . Asthma   . Allergy   . Depression    Allergies  Allergen Reactions  . Ketorolac Tromethamine Other (See Comments)    Pain at the injection site  . Hydrocodone-Acetaminophen Rash    Only allergic to [employer]; Hydrocodone tablets are ok   Current Outpatient Prescriptions  Medication Sig Dispense Refill  . betamethasone valerate ointment (VALISONE) 0.1 % Apply 1 application topically 2 (two) times daily.  90 g  1  . carisoprodol (SOMA) 350 MG tablet Take 1 tablet (350 mg total) by mouth 4 (four) times daily as needed for muscle spasms.  60 tablet  3  . celecoxib (CELEBREX) 200 MG capsule Take 200 mg by mouth daily.      . cholecalciferol (VITAMIN D) 1000 UNITS tablet Take 1,000 Units by mouth daily.      . ciprofloxacin (CILOXAN) 0.3 % ophthalmic solution Place 1 drop into the right eye every 4 (four) hours while awake. Administer 1 drop, every 2 hours, while awake, for 2 days. Then 1 drop, every 4  hours, while awake, for the next 5 days.  5 mL  0  . fluticasone (FLONASE) 50 MCG/ACT nasal spray Place 2 sprays into both nostrils daily.  16 g  6  . HYDROcodone-acetaminophen (NORCO) 5-325 MG per tablet Take 1 tablet by mouth every 8 (eight) hours as needed.  15 tablet  0  . omeprazole (PRILOSEC) 40 MG capsule Take 1 capsule (40 mg total) by mouth daily.  90 capsule  1  . promethazine-codeine (PHENERGAN WITH CODEINE) 6.25-10 MG/5ML syrup Take 5-10 mLs by mouth every 6 (six) hours as needed.  120 mL  0  . pseudoephedrine-guaifenesin (MUCINEX D) 60-600 MG per tablet Take 1 tablet by mouth every  12 (twelve) hours.  18 tablet  0  . terbinafine (LAMISIL) 250 MG tablet Take 1 tablet (250 mg total) by mouth daily.  30 tablet  5   No current facility-administered medications for this visit.       Objective:    BP 124/80  Pulse 92  Temp(Src) 99 F (37.2 C) (Oral)  Resp 16  Ht 5\' 5"  (1.651 m)  Wt 226 lb (102.513 kg)  BMI 37.61 kg/m2  SpO2 98%  LMP 06/09/2014 Physical Exam  Nursing note and vitals reviewed. Constitutional: She is oriented to person, place, and time. She appears well-developed and well-nourished. No distress.  HENT:  Head: Atraumatic.  Right Ear: Tympanic membrane is retracted. A middle ear effusion is present.  Left Ear: Tympanic membrane is retracted. A middle ear effusion is present.  Nose: Mucosal edema and rhinorrhea present.  Mouth/Throat: Oropharynx is clear and moist. No oropharyngeal exudate, posterior oropharyngeal edema or posterior oropharyngeal erythema.  Eyes: Conjunctivae and EOM are normal.  Neck: Neck supple. No tracheal deviation present. No mass and no thyromegaly present.  Cardiovascular: Normal rate, regular rhythm and S1 normal.   Pulmonary/Chest: Effort normal and breath sounds normal. No respiratory distress. She has no decreased breath sounds. She has no wheezes. She has no rhonchi. She has no rales.  Musculoskeletal: Normal range of motion.  Neurological: She is alert and oriented to person, place, and time.  Skin: Skin is warm and dry. There is erythema.  Heel left lateral malleous and greater toes presents with flaking and erythema. Similar rash to right toe. lesion on right calf, an area of prior trauma. Crack , pitting and lifting of nail; worse on fourth finger of left hand.  Dry cracking and swelling along with nail bed of left hand.    mall 2-3 mm furuncle rough nodule under nail fold on distal right second finger  Psychiatric: She has a normal mood and affect. Her behavior is normal.   Results for orders placed in visit on  01/09/14  COMPLETE METABOLIC PANEL WITH GFR      Result Value Ref Range   Sodium 138  135 - 145 mEq/L   Potassium 4.6  3.5 - 5.3 mEq/L   Chloride 104  96 - 112 mEq/L   CO2 27  19 - 32 mEq/L   Glucose, Bld 93  70 - 99 mg/dL   BUN 10  6 - 23 mg/dL   Creat 0.62  0.50 - 1.10 mg/dL   Total Bilirubin 0.4  0.3 - 1.2 mg/dL   Alkaline Phosphatase 120 (*) 39 - 117 U/L   AST 13  0 - 37 U/L   ALT 12  0 - 35 U/L   Total Protein 6.7  6.0 - 8.3 g/dL   Albumin 4.1  3.5 - 5.2  g/dL   Calcium 9.2  8.4 - 10.5 mg/dL   GFR, Est African American >89     GFR, Est Non African American >89    POCT URINALYSIS DIPSTICK      Result Value Ref Range   Color, UA yellow     Clarity, UA clear     Glucose, UA neg     Bilirubin, UA neg     Ketones, UA neg     Spec Grav, UA 1.025     Blood, UA trace     pH, UA 7.0     Protein, UA neg     Urobilinogen, UA 0.2     Nitrite, UA neg     Leukocytes, UA small (1+)    POCT WET PREP WITH KOH      Result Value Ref Range   Trichomonas, UA Negative     Clue Cells Wet Prep HPF POC 0-8     Epithelial Wet Prep HPF POC 0-26     Yeast Wet Prep HPF POC neg     Bacteria Wet Prep HPF POC 2+     RBC Wet Prep HPF POC 0-4     WBC Wet Prep HPF POC 1-tntc     KOH Prep POC Positive    PAP IG, CT-NG, RFX HPV ASCU      Result Value Ref Range   Specimen adequacy:       FINAL DIAGNOSIS:       COMMENTS:       Cytotechnologist:       Chlamydia Probe Amp NEGATIVE     GC Probe Amp NEGATIVE         Assessment & Plan:   Psoriatic arthritis - Pt has been seeing Dr. Trudie Reed who has not made this definitive diagnosis yet as was referred to derm - saw a PA once who just treated pt for nail fungus - po lamisil did not improve nails at all and nail psoriasis is freq mistaken for onychomycosis. I get the sense that Dr. Trudie Reed does not want to diagnose psoriatic arthritis until psoriasis rash is diagnosed which derm didn't do. Will touch base with Dr. Trudie Reed and discuss if we could prove  psoriasis w/ biopsy then would pt be a candidate for a DMARD? Pt really wanting to be on DMARD despite risks due to severity of sxs - will need financial help with dmard rx as well.  Urinary urgency - Plan: POCT urinalysis dipstick, POCT UA - Microscopic Only  Allergic rhinitis due to animal hair and dander - cont otc alegra and add in Singulair  Cough  Psoriasis - If pt needs definitive diagnosis for poss arthritis treatment, recommend pt f/u here for punch biopsy of rash.  Wart - cryotherapy to lesion - discussed likely need to cont treatment for complete relief with continued cryotherapy treatments vs otc top acids, duc tape, etc.  Meds ordered this encounter  Medications  . betamethasone valerate ointment (VALISONE) 0.1 %    Sig: Apply 1 application topically 2 (two) times daily.    Dispense:  90 g    Refill:  1    Mix in 1:1 ratio with petroleum ointment/jelly.  . carisoprodol (SOMA) 350 MG tablet    Sig: Take 1 tablet (350 mg total) by mouth 4 (four) times daily as needed for muscle spasms.    Dispense:  60 tablet    Refill:  3  . HYDROcodone-acetaminophen (NORCO) 5-325 MG per tablet    Sig: Take 1  tablet by mouth every 8 (eight) hours as needed.    Dispense:  30 tablet    Refill:  0    Order Specific Question:  Supervising Provider    Answer:  Wardell Honour [[id number]]  . montelukast (SINGULAIR) 10 MG tablet    Sig: Take 1 tablet (10 mg total) by mouth at bedtime.    Dispense:  30 tablet    Refill:  3  . omeprazole (PRILOSEC) 40 MG capsule    Sig: Take 1 capsule (40 mg total) by mouth daily.    Dispense:  90 capsule    Refill:  1    Order Specific Question:  Supervising Provider    Answer:  DOOLITTLE, ROBERT P [[id number]]    I personally performed the services described in this documentation, which was scribed in my presence. The recorded information has been reviewed and considered, and addended by me as needed.  Delman Cheadle, MD MPH

## 2014-07-02 ENCOUNTER — Encounter: Payer: Self-pay | Admitting: Family Medicine

## 2014-07-16 ENCOUNTER — Ambulatory Visit (INDEPENDENT_AMBULATORY_CARE_PROVIDER_SITE_OTHER): Payer: BC Managed Care – PPO

## 2014-07-16 ENCOUNTER — Ambulatory Visit (INDEPENDENT_AMBULATORY_CARE_PROVIDER_SITE_OTHER): Payer: BC Managed Care – PPO | Admitting: Family Medicine

## 2014-07-16 VITALS — BP 110/80 | HR 88 | Temp 98.5°F | Resp 16 | Ht 65.75 in | Wt 225.0 lb

## 2014-07-16 DIAGNOSIS — R1032 Left lower quadrant pain: Secondary | ICD-10-CM

## 2014-07-16 DIAGNOSIS — R109 Unspecified abdominal pain: Secondary | ICD-10-CM

## 2014-07-16 DIAGNOSIS — R10A2 Flank pain, left side: Secondary | ICD-10-CM

## 2014-07-16 DIAGNOSIS — Z87442 Personal history of urinary calculi: Secondary | ICD-10-CM

## 2014-07-16 DIAGNOSIS — R350 Frequency of micturition: Secondary | ICD-10-CM

## 2014-07-16 LAB — POCT CBC
Granulocyte percent: 66.3 %G (ref 37–80)
HCT, POC: 37.7 % (ref 37.7–47.9)
Hemoglobin: 12.2 g/dL (ref 12.2–16.2)
Lymph, poc: 3.2 (ref 0.6–3.4)
MCH, POC: 29.1 pg (ref 27–31.2)
MCHC: 32.3 g/dL (ref 31.8–35.4)
MCV: 90 fL (ref 80–97)
MID (cbc): 0.6 (ref 0–0.9)
MPV: 8.3 fL (ref 0–99.8)
POC Granulocyte: 7.5 — AB (ref 2–6.9)
POC LYMPH PERCENT: 28.1 %L (ref 10–50)
POC MID %: 5.6 %M (ref 0–12)
Platelet Count, POC: 339 10*3/uL (ref 142–424)
RBC: 4.19 M/uL (ref 4.04–5.48)
RDW, POC: 13.8 %
WBC: 11.3 10*3/uL — AB (ref 4.6–10.2)

## 2014-07-16 LAB — POCT URINALYSIS DIPSTICK
Bilirubin, UA: NEGATIVE
Glucose, UA: NEGATIVE
Ketones, UA: NEGATIVE
Nitrite, UA: NEGATIVE
Protein, UA: 30
Spec Grav, UA: 1.025
Urobilinogen, UA: 0.2
pH, UA: 6

## 2014-07-16 LAB — POCT UA - MICROSCOPIC ONLY
Casts, Ur, LPF, POC: NEGATIVE
Crystals, Ur, HPF, POC: NEGATIVE
Yeast, UA: NEGATIVE

## 2014-07-16 IMAGING — CR DG CERVICAL SPINE 2 OR 3 VIEWS
5 series · 5 of 5 positions shown · non-contrast
Comparison: None.

CLINICAL DATA: Neck pain.

CERVICAL SPINE - 2-3 VIEW

[lateral]
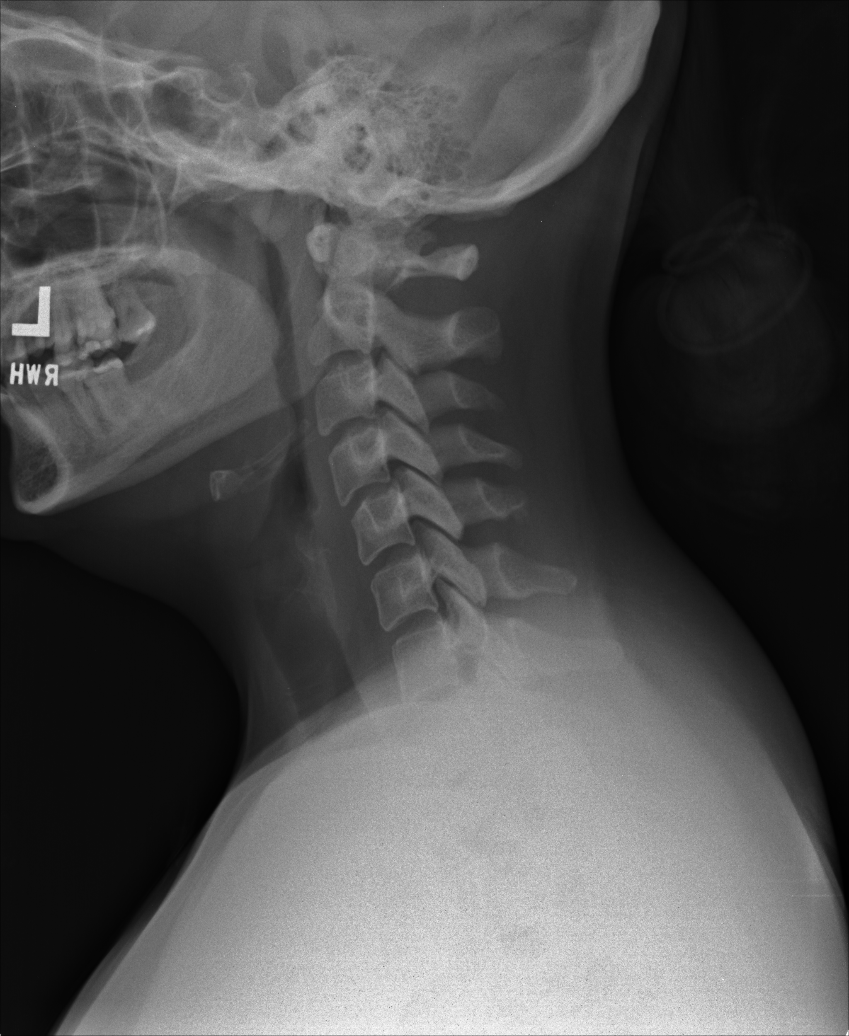

[AP (1 of 3)]
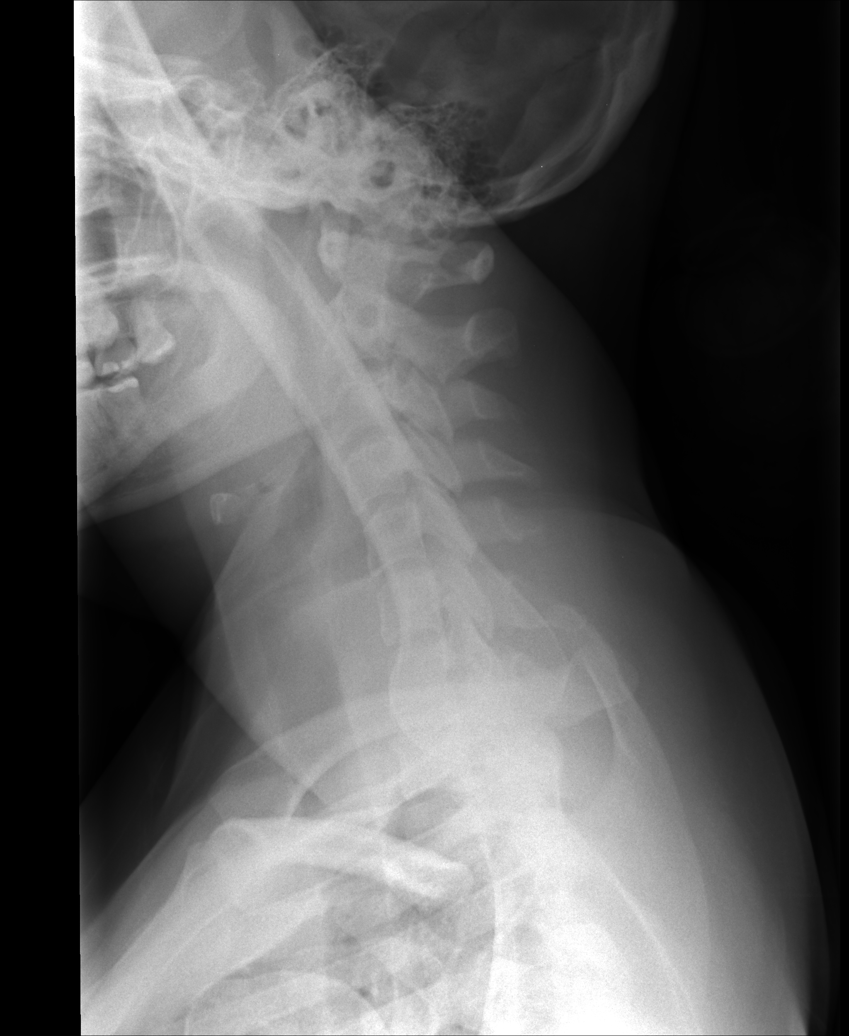

[AP (2 of 3)]
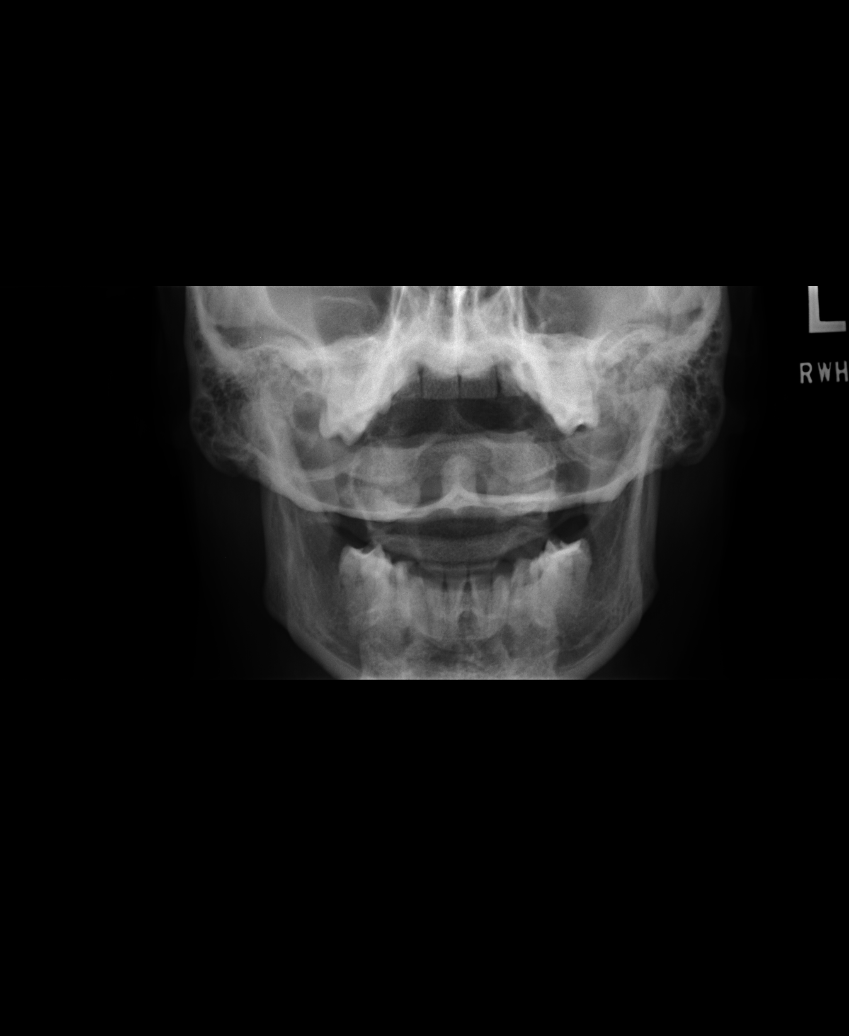

[AP (3 of 3)]
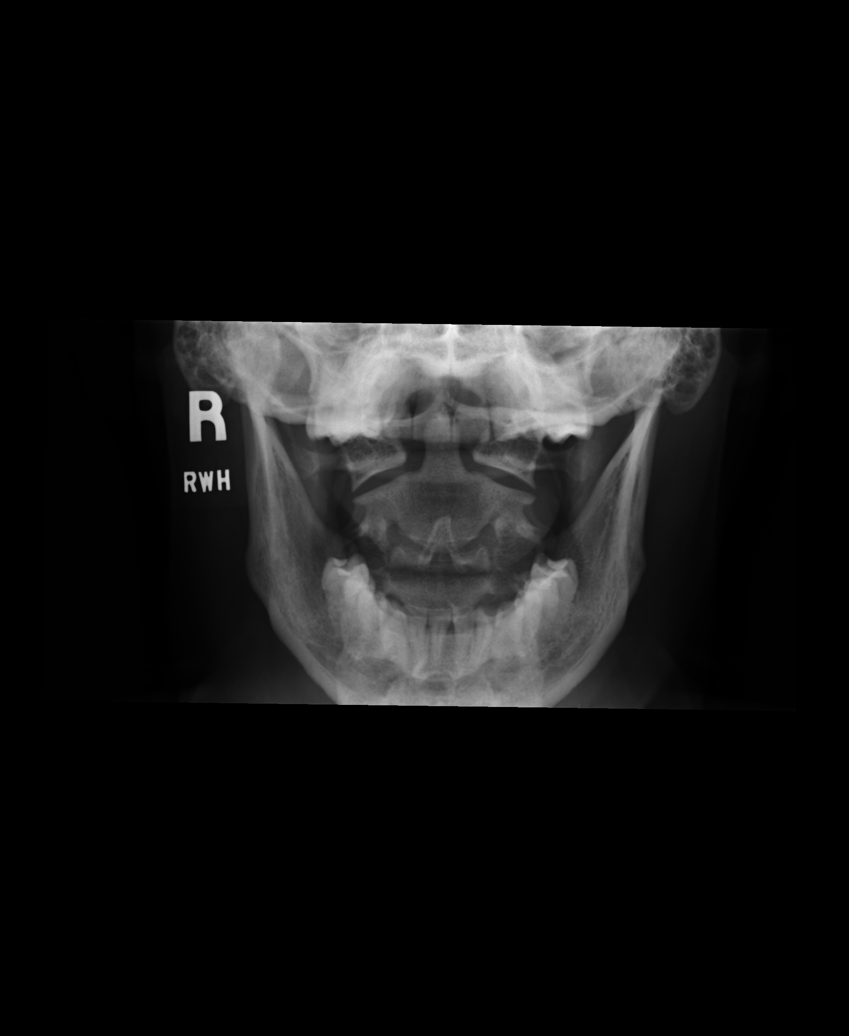

[other]
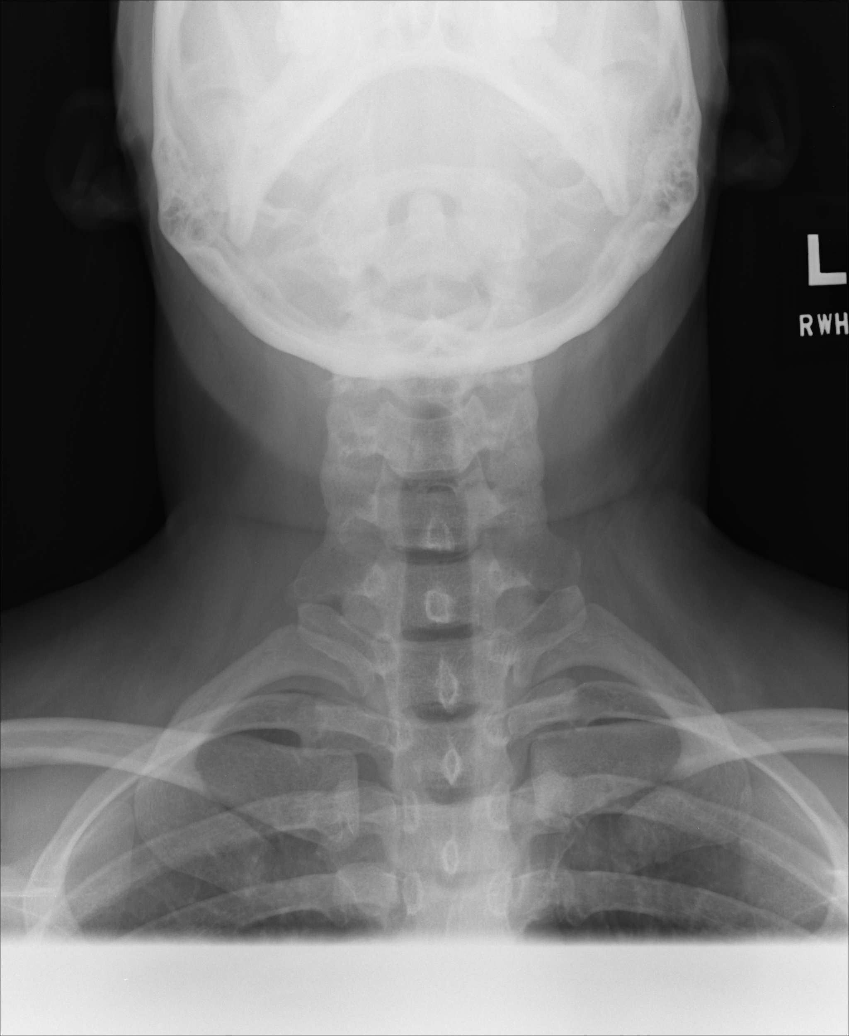

[5 of 5 positions shown; findings below may reference images not displayed]

FINDINGS: Vertebral body height and alignment are normal.
Intervertebral disc space height is normal.  Prevertebral soft
tissues appear normal.  Lung apices are clear.
IMPRESSION: Negative exam.

## 2014-07-16 IMAGING — CR DG THORACIC SPINE 2V
2 series · 2 of 2 positions shown · non-contrast
Comparison: PA and lateral chest 10/23/2012.

CLINICAL DATA: Back pain.

THORACIC SPINE - 2 VIEW

[lateral]
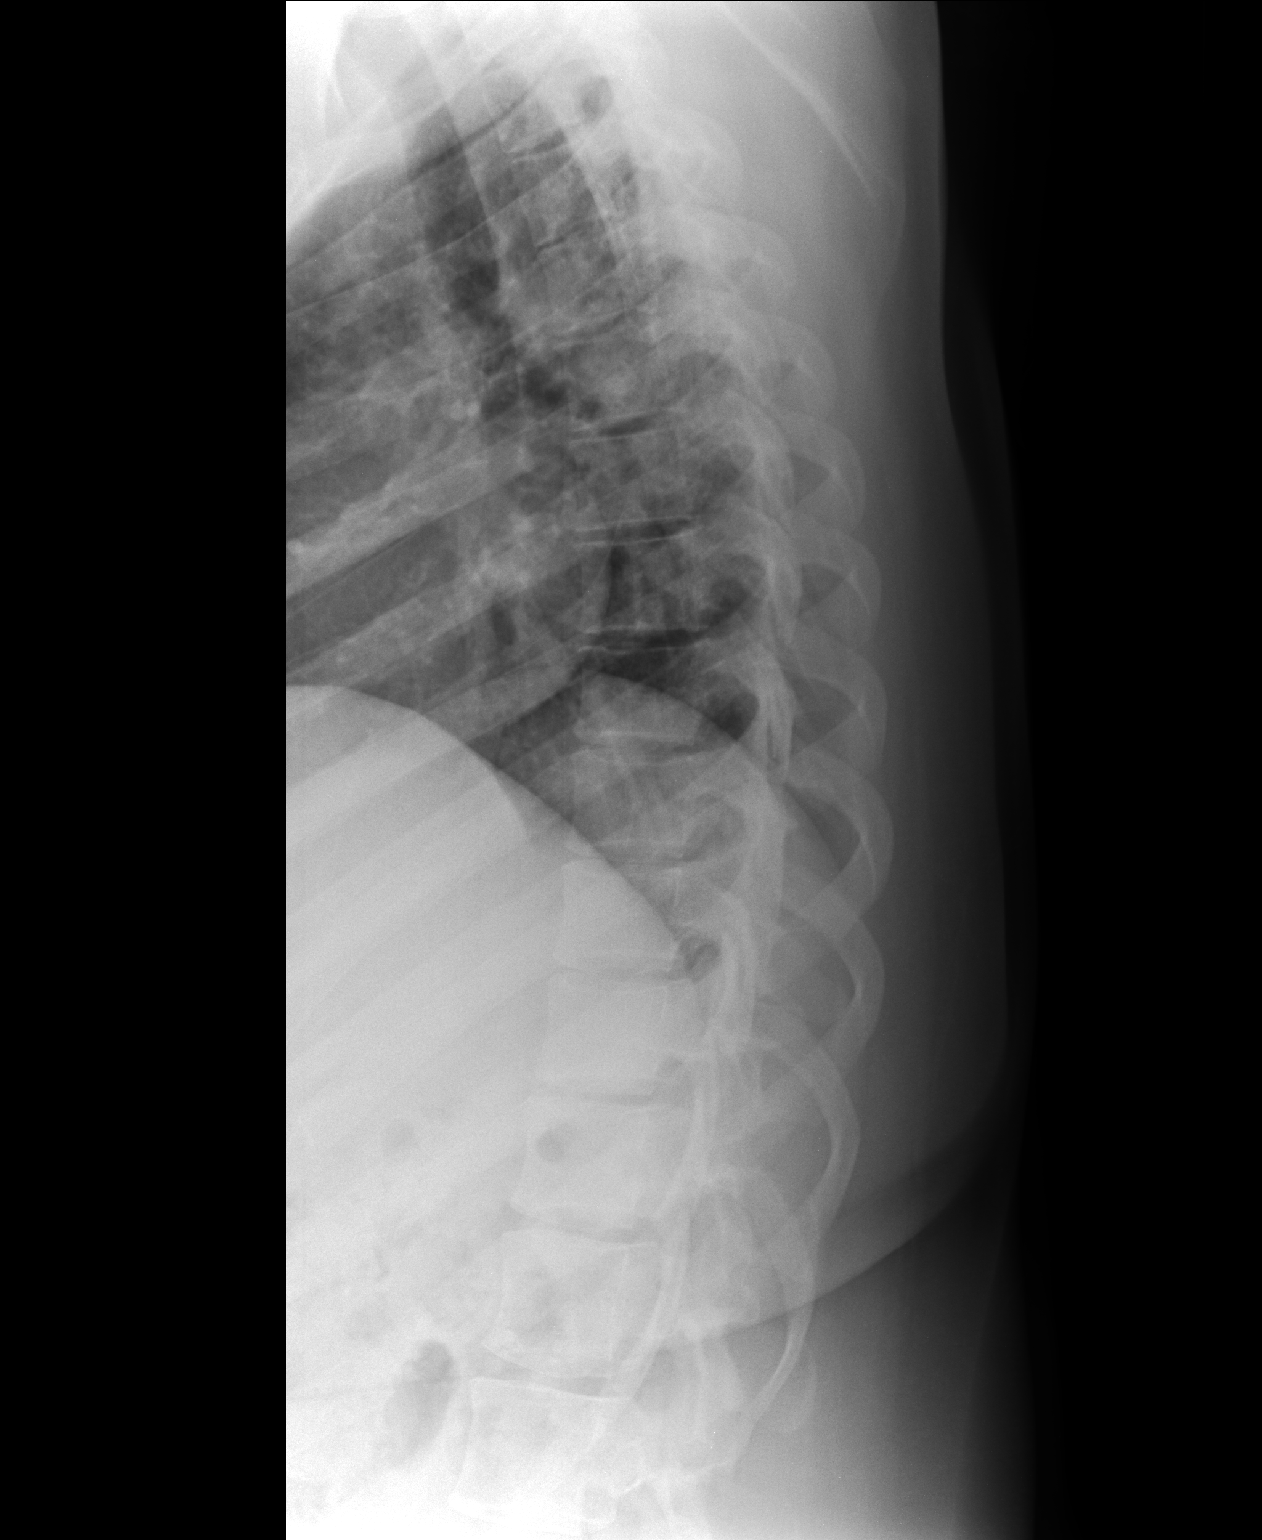

[AP]
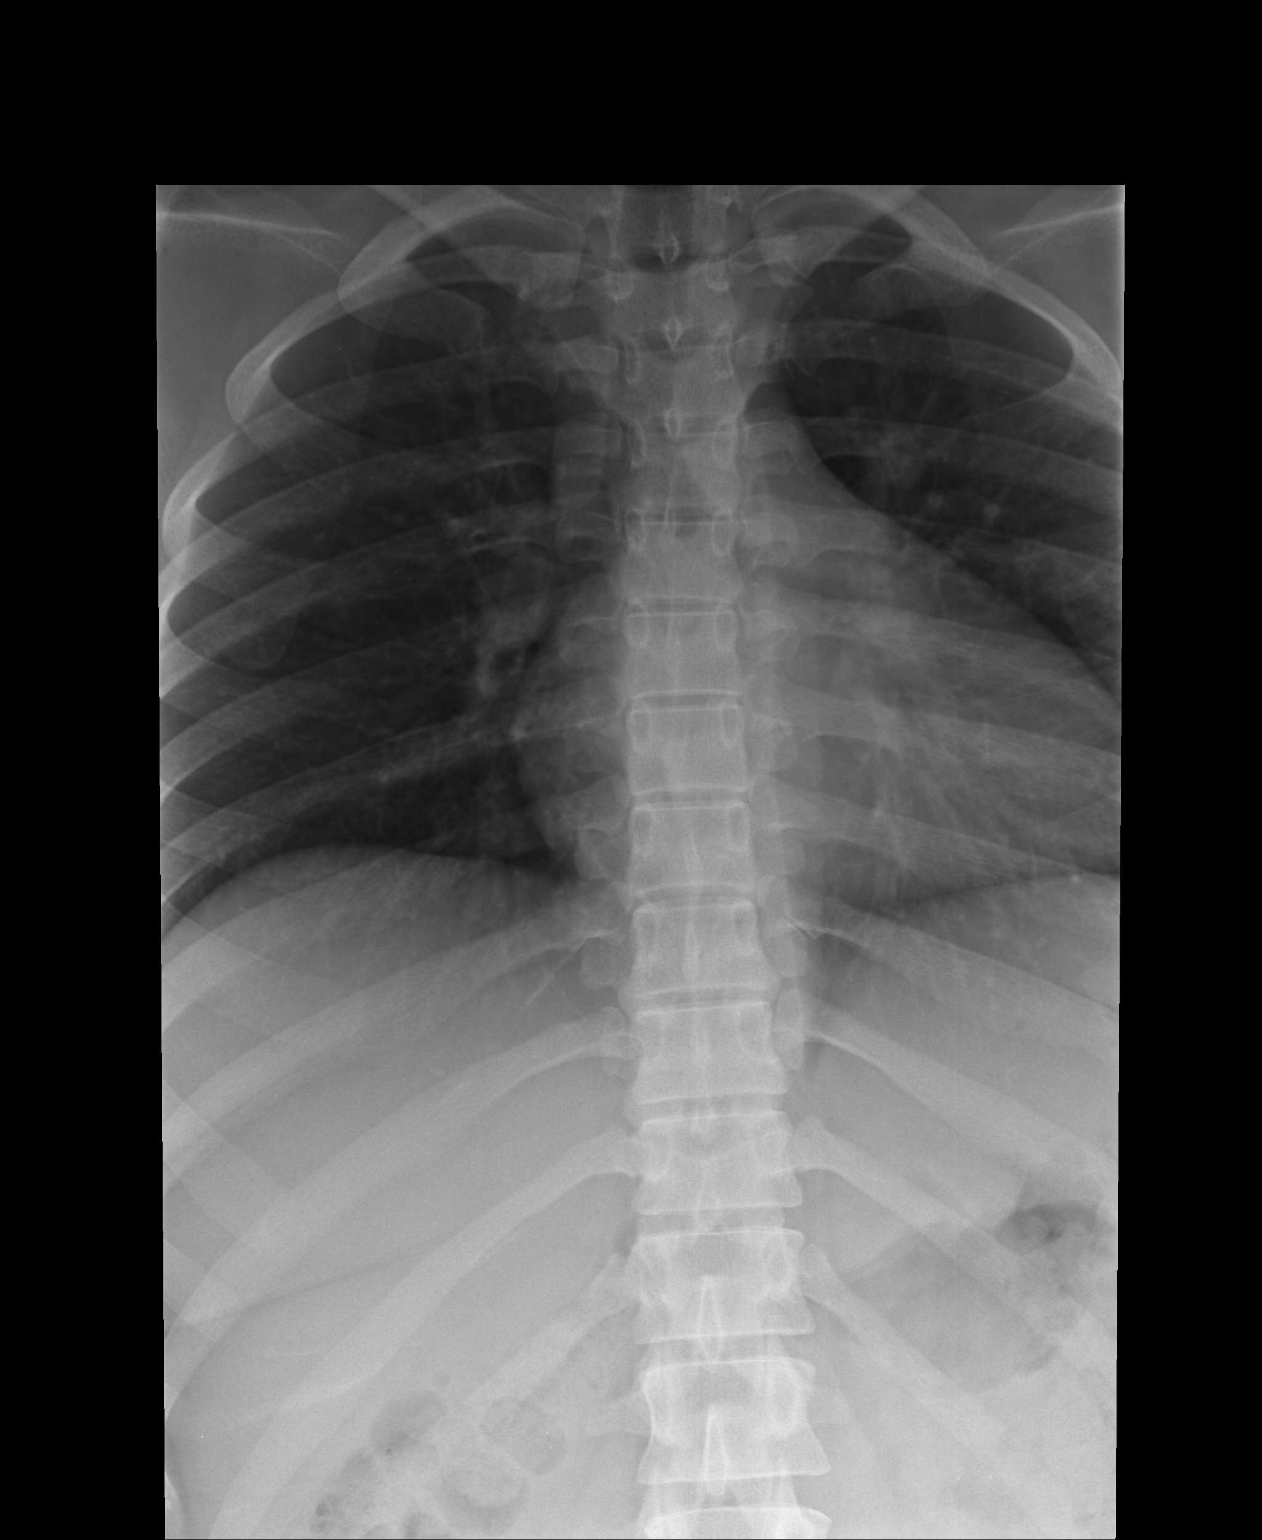

[2 of 2 positions shown; findings below may reference images not displayed]

FINDINGS: Vertebral body height and alignment are normal.
Intervertebral disc space height is normal.  Paraspinous soft
tissue structures appear normal.
IMPRESSION: Normal study.

## 2014-07-16 MED ORDER — TAMSULOSIN HCL 0.4 MG PO CAPS: 0.4000 mg | ORAL_CAPSULE | Freq: Every day | ORAL | Status: DC

## 2014-07-16 MED ORDER — IBUPROFEN 800 MG PO TABS: 800.0000 mg | ORAL_TABLET | Freq: Three times a day (TID) | ORAL | Status: DC | PRN

## 2014-07-16 MED ORDER — CIPROFLOXACIN HCL 500 MG PO TABS: 500.0000 mg | ORAL_TABLET | Freq: Two times a day (BID) | ORAL | Status: DC

## 2014-07-16 NOTE — Progress Notes (Signed)
Subjective:    Patient ID: Dawn Dunn, female    DOB: September 22, 1986, 28 y.o.   MRN: [id number]  HPI 28 year old female presents for evaluation of left sided flank pain that started acutely this a.m at 6:30. States the pain starts in her left flank and radiates to her LLQ. She has a hx of kidney stones in the past. Last episode was 2-3 years ago. Initially diagnosed 10 years ago and did have a cystoscopy at that time. Has had only calcium stones in the past.  Her pain today has been in waves ranging from 10/10 to no pain. She has not taken any OTC pain meds. Does have an rx for hydrocodone at home that she takes as needed for her chronic pain. Has not taken that today since she has been at work. Admits to slight nausea but no vomiting. No fever or chills. +urinary frequency but no dysuria.  LNMP 7/14    Review of Systems  Constitutional: Negative for fever and chills.  Gastrointestinal: Positive for nausea and abdominal pain. Negative for vomiting.  Genitourinary: Positive for frequency and flank pain. Negative for dysuria.       Objective:   Physical Exam  Constitutional: She appears well-developed and well-nourished.  HENT:  Head: Normocephalic and atraumatic.  Right Ear: External ear normal.  Left Ear: External ear normal.  Eyes: Conjunctivae are normal.  Neck: Normal range of motion.  Cardiovascular: Normal rate, regular rhythm and normal heart sounds.   Pulmonary/Chest: Effort normal and breath sounds normal.  Abdominal: Soft. Bowel sounds are normal. There is tenderness in the left lower quadrant. There is CVA tenderness (left).    Results for orders placed in visit on 07/16/14  POCT URINALYSIS DIPSTICK      Result Value Ref Range   Color, UA yellow     Clarity, UA cloudy     Glucose, UA neg     Bilirubin, UA neg     Ketones, UA neg     Spec Grav, UA 1.025     Blood, UA large     pH, UA 6.0     Protein, UA 30     Urobilinogen, UA 0.2     Nitrite, UA neg     Leukocytes, UA Trace    POCT UA - MICROSCOPIC ONLY      Result Value Ref Range   WBC, Ur, HPF, POC 2-4     RBC, urine, microscopic TNTC     Bacteria, U Microscopic trace     Mucus, UA 1+     Epithelial cells, urine per micros 4-6     Crystals, Ur, HPF, POC neg     Casts, Ur, LPF, POC neg     Yeast, UA neg    POCT CBC      Result Value Ref Range   WBC 11.3 (*) 4.6 - 10.2 K/uL   Lymph, poc 3.2  0.6 - 3.4   POC LYMPH PERCENT 28.1  10 - 50 %L   MID (cbc) 0.6  0 - 0.9   POC MID % 5.6  0 - 12 %M   POC Granulocyte 7.5 (*) 2 - 6.9   Granulocyte percent 66.3  37 - 80 %G   RBC 4.19  4.04 - 5.48 M/uL   Hemoglobin 12.2  12.2 - 16.2 g/dL   HCT, POC 37.7  37.7 - 47.9 %   MCV 90.0  80 - 97 fL   MCH, POC 29.1  27 - 31.2  pg   MCHC 32.3  31.8 - 35.4 g/dL   RDW, POC 13.8     Platelet Count, POC 339  142 - 424 K/uL   MPV 8.3  0 - 99.8 fL        [facility] reading (PRIMARY) by  Dr. Tamala Julian as no acute abnormality.   Assessment & Plan:  Abdominal pain, left lower quadrant - Plan: POCT urinalysis dipstick, POCT UA - Microscopic Only, POCT CBC, DG Abd 1 View, ibuprofen (ADVIL,MOTRIN) 800 MG tablet  Left flank pain - Plan: POCT CBC, DG Abd 1 View, ibuprofen (ADVIL,MOTRIN) 800 MG tablet, tamsulosin (FLOMAX) 0.4 MG CAPS capsule  History of kidney stones  Urinary frequency - Plan: Urine culture, ciprofloxacin (CIPRO) 500 MG tablet  Likely pain due to kidney stone. Will cover for early pyelonephritis secondary to leukocytosis and UA with leukocytes. Urine culture pending. Start flomax 0.4 mg daily and ibuprofen 800 mg tid with food. May taken hydrocodone that she has at home for severe pain. RTC precautions discussed. F/u if symptoms worsening or fail to improve.

## 2014-07-18 LAB — URINE CULTURE: Colony Count: 55000

## 2014-07-20 ENCOUNTER — Ambulatory Visit (INDEPENDENT_AMBULATORY_CARE_PROVIDER_SITE_OTHER): Payer: BC Managed Care – PPO

## 2014-07-20 ENCOUNTER — Ambulatory Visit (INDEPENDENT_AMBULATORY_CARE_PROVIDER_SITE_OTHER): Payer: BC Managed Care – PPO | Admitting: Family Medicine

## 2014-07-20 VITALS — BP 101/69 | HR 76 | Temp 98.2°F | Resp 20 | Ht 64.0 in | Wt 231.5 lb

## 2014-07-20 DIAGNOSIS — N200 Calculus of kidney: Secondary | ICD-10-CM

## 2014-07-20 DIAGNOSIS — M25571 Pain in right ankle and joints of right foot: Secondary | ICD-10-CM

## 2014-07-20 DIAGNOSIS — R21 Rash and other nonspecific skin eruption: Secondary | ICD-10-CM

## 2014-07-20 DIAGNOSIS — Z87442 Personal history of urinary calculi: Secondary | ICD-10-CM

## 2014-07-20 DIAGNOSIS — M25579 Pain in unspecified ankle and joints of unspecified foot: Secondary | ICD-10-CM

## 2014-07-20 LAB — POCT URINALYSIS DIPSTICK
Bilirubin, UA: NEGATIVE
Blood, UA: NEGATIVE
Glucose, UA: NEGATIVE
KETONES UA: NEGATIVE
NITRITE UA: NEGATIVE
Protein, UA: 30
Spec Grav, UA: 1.01
Urobilinogen, UA: 0.2
pH, UA: 5.5

## 2014-07-20 LAB — POCT UA - MICROSCOPIC ONLY
Casts, Ur, LPF, POC: NEGATIVE
Crystals, Ur, HPF, POC: NEGATIVE
Mucus, UA: NEGATIVE
YEAST UA: NEGATIVE

## 2014-07-20 MED ORDER — OXYCODONE-ACETAMINOPHEN 5-325 MG PO TABS: 1.0000 | ORAL_TABLET | Freq: Three times a day (TID) | ORAL | Status: DC | PRN

## 2014-07-20 MED ORDER — KETOROLAC TROMETHAMINE 10 MG PO TABS: 10.0000 mg | ORAL_TABLET | Freq: Four times a day (QID) | ORAL | Status: DC | PRN

## 2014-07-20 MED ORDER — CEPHALEXIN 500 MG PO CAPS: 500.0000 mg | ORAL_CAPSULE | Freq: Four times a day (QID) | ORAL | Status: DC

## 2014-07-20 MED ORDER — KETOROLAC TROMETHAMINE 60 MG/2ML IM SOLN
60.0000 mg | Freq: Once | INTRAMUSCULAR | Status: AC
  Administered 2014-07-20: 60 mg via INTRAMUSCULAR

## 2014-07-20 MED ORDER — ONDANSETRON 8 MG PO TBDP: 8.0000 mg | ORAL_TABLET | Freq: Three times a day (TID) | ORAL | Status: DC | PRN

## 2014-07-20 NOTE — Progress Notes (Addendum)
Subjective:  This chart was scribed for Dawn Cheadle, MD  by Dawn Dunn, ED Scribe. This Patient was seen in room 10 and the patients care was started at 8:32 PM   Patient ID: Dawn Dunn, female    DOB: January 06, 1986, 28 y.o.   MRN: [id number] Chief Complaint  Patient presents with  . Follow-up    Kidney Stones    HPI Dawn Dunn is a 28 y.o. female  For follow up for kidney stones.  She states has been having right sided back pain onset 1 day prior. She states she is having some associated nausea, vomiting. She states that x-ray called and told her they noticed a new kidney stone. She states she uses a stainer at home and haven't noticed a stone pasing. She states that today she hasn't been drinking as many fluids as needed. She states she has been taken ibuprofen with no relief. She states the pain feels similar to previous kidney stones. She denies hematuria, dysuria, fever or chills.  Pt states that is has been 10 years since she has had a Toradol shot with an allergic reaction. She states that she will take the shot today.   Pt also complaining that she may have an infection to her right great toe. She has associated rash around the great to as well.  She states denies any drainage from this area.   CLINICAL DATA: Left lower quadrant pain.  EXAM:  ABDOMEN - 1 VIEW  COMPARISON: 11/06/2013  FINDINGS:  Nonobstructive bowel gas pattern. No free air organomegaly. Tiny  punctate calcification projects over the lower pole of the right  kidney. No suspicious calcification over the expected course of the  ureters or the bladder. No acute bony abnormality.  IMPRESSION:  Suspect right lower pole nephrolithiasis.  No acute findings.  Electronically Signed  By: Rolm Baptise M.D.  On: 07/17/2014 09:06   Past Medical History  Diagnosis Date  . Kidney stones   . Anemia   . Fibromyalgia   . Mild cervical dysplasia   . Asthma   . Allergy   . Depression    Current Outpatient  Prescriptions on File Prior to Visit  Medication Sig Dispense Refill  . betamethasone valerate ointment (VALISONE) 0.1 % Apply 1 application topically 2 (two) times daily.  90 g  1  . carisoprodol (SOMA) 350 MG tablet Take 1 tablet (350 mg total) by mouth 4 (four) times daily as needed for muscle spasms.  60 tablet  3  . cholecalciferol (VITAMIN D) 1000 UNITS tablet Take 1,000 Units by mouth daily.      . ciprofloxacin (CIPRO) 500 MG tablet Take 1 tablet (500 mg total) by mouth 2 (two) times daily.  14 tablet  0  . fluticasone (FLONASE) 50 MCG/ACT nasal spray Place 2 sprays into both nostrils daily.  16 g  6  . HYDROcodone-acetaminophen (NORCO) 5-325 MG per tablet Take 1 tablet by mouth every 8 (eight) hours as needed.  30 tablet  0  . ibuprofen (ADVIL,MOTRIN) 800 MG tablet Take 1 tablet (800 mg total) by mouth every 8 (eight) hours as needed.  30 tablet  0  . montelukast (SINGULAIR) 10 MG tablet Take 1 tablet (10 mg total) by mouth at bedtime.  30 tablet  3  . omeprazole (PRILOSEC) 40 MG capsule Take 1 capsule (40 mg total) by mouth daily.  90 capsule  1  . tamsulosin (FLOMAX) 0.4 MG CAPS capsule Take 1 capsule (0.4 mg total) by mouth daily.  30 capsule  0  . terbinafine (LAMISIL) 250 MG tablet Take 1 tablet (250 mg total) by mouth daily.  30 tablet  5   No current facility-administered medications on file prior to visit.   Allergies  Allergen Reactions  . Ketorolac Tromethamine Other (See Comments)    Pain at the injection site  . Hydrocodone-Acetaminophen Rash    Only allergic to [employer]; Hydrocodone tablets are ok   Review of Systems  Constitutional: Negative for fever and chills.  Gastrointestinal: Positive for nausea and vomiting.  Genitourinary: Negative for dysuria and hematuria.  Skin: Positive for wound.   Objective:  BP 101/69  Pulse 76  Temp(Src) 98.2 F (36.8 C) (Oral)  Resp 20  Ht 5\' 4"  (1.626 m)  Wt 231 lb 8 oz (105.008 kg)  BMI 39.72 kg/m2  SpO2 98%  LMP  07/07/2014   Physical Exam  Nursing note and vitals reviewed. Constitutional: She is oriented to person, place, and time. She appears well-developed and well-nourished. No distress.  HENT:  Head: Normocephalic and atraumatic.  Eyes: Conjunctivae and EOM are normal.  Neck: Neck supple. No tracheal deviation present.  Cardiovascular: Normal rate.   Pulmonary/Chest: Effort normal. No respiratory distress.  Abdominal: There is tenderness. There is guarding and CVA tenderness. There is no rebound.  Pain with left CVA tenderness, pain bilateral lower quadrants  Musculoskeletal: Normal range of motion.  Neurological: She is alert and oriented to person, place, and time.  Skin: Skin is warm and dry.  Psychiatric: She has a normal mood and affect. Her behavior is normal.   Results for orders placed in visit on 07/20/14  POCT UA - MICROSCOPIC ONLY      Result Value Ref Range   WBC, Ur, HPF, POC 8-12     RBC, urine, microscopic 2-4     Bacteria, U Microscopic trace     Mucus, UA neg     Epithelial cells, urine per micros 2-4     Crystals, Ur, HPF, POC neg     Casts, Ur, LPF, POC neg     Yeast, UA neg    POCT URINALYSIS DIPSTICK      Result Value Ref Range   Color, UA yellow     Clarity, UA hzay     Glucose, UA neg     Bilirubin, UA neg     Ketones, UA neg     Spec Grav, UA 1.010     Blood, UA neg     pH, UA 5.5     Protein, UA 30     Urobilinogen, UA 0.2     Nitrite, UA neg     Leukocytes, UA Trace      Primary x-ray by Dr. Brigitte Pulse KUB white density seen in left upper abdomen, -  question kidney stone - don't see right stone that was noted previously.   CLINICAL DATA: Kidney stone.  EXAM: ABDOMEN - 1 VIEW  COMPARISON: 07/16/2014 and 11/06/2013  FINDINGS: There is a faint 4 mm density to the left of midline at the L2-3 level which could be a stone in the proximal left ureter. There is a 2 mm stone in the lower pole the right kidney.  The bowel gas pattern is normal. No  osseous abnormality.  IMPRESSION: Possible 4 mm stone in the proximal left ureter. Stable small stone in the lower pole of the right kidney.  Assessment & Plan:   Kidney stones - Plan: DG Abd 1 View, ketorolac (TORADOL) injection 60  mg, Urine culture  - tolerated toradol fine so will take off allergy list. KUB repeated today to see if seems to be moving at all or can get idea on size of stone. Needs to increase fluids - at least 4L/d.  Can take po toradol while working and oxycodone at night, keep straining urine.  RTC if not moving as will have to reconsider CT/urology.  History of kidney stones - Plan: POCT UA - Microscopic Only, POCT urinalysis dipstick   Chronic rash - would like to biopsy to confirm psoriasis vs dyshydrotic eczema but only on palms/soles today so will hold off as don't heal well - if gets on other areas, will biopsy then.    Chronic pain - if acute synovitis or flair of joint pain - f/u acutely w/ Dr. Trudie Reed for in office [place] to see if could be psoriatic arthritis   Meds ordered this encounter  Medications  . ketorolac (TORADOL) injection 60 mg    Sig:   . ketorolac (TORADOL) 10 MG tablet    Sig: Take 1 tablet (10 mg total) by mouth every 6 (six) hours as needed.    Dispense:  20 tablet    Refill:  0  . oxyCODONE-acetaminophen (ROXICET) 5-325 MG per tablet    Sig: Take 1 tablet by mouth every 8 (eight) hours as needed for severe pain.    Dispense:  20 tablet    Refill:  0  . ondansetron (ZOFRAN ODT) 8 MG disintegrating tablet    Sig: Take 1 tablet (8 mg total) by mouth every 8 (eight) hours as needed for nausea or vomiting.    Dispense:  20 tablet    Refill:  0  . cephALEXin (KEFLEX) 500 MG capsule    Sig: Take 1 capsule (500 mg total) by mouth 4 (four) times daily.    Dispense:  28 capsule    Refill:  0    I personally performed the services described in this documentation, which was scribed in my presence. The recorded information has been reviewed and  considered, and addended by me as needed.  Dawn Cheadle, MD MPH

## 2014-07-20 NOTE — Patient Instructions (Signed)
Kidney Stones Kidney stones (urolithiasis) are deposits that form inside your kidneys. The intense pain is caused by the stone moving through the urinary tract. When the stone moves, the ureter goes into spasm around the stone. The stone is usually passed in the urine.  CAUSES   A disorder that makes certain neck glands produce too much parathyroid hormone (primary hyperparathyroidism).  A buildup of uric acid crystals, similar to gout in your joints.  Narrowing (stricture) of the ureter.  A kidney obstruction present at birth (congenital obstruction).  Previous surgery on the kidney or ureters.  Numerous kidney infections. SYMPTOMS   Feeling sick to your stomach (nauseous).  Throwing up (vomiting).  Blood in the urine (hematuria).  Pain that usually spreads (radiates) to the groin.  Frequency or urgency of urination. DIAGNOSIS   Taking a history and physical exam.  Blood or urine tests.  CT scan.  Occasionally, an examination of the inside of the urinary bladder (cystoscopy) is performed. TREATMENT   Observation.  Increasing your fluid intake.  Extracorporeal shock wave lithotripsy--This is a noninvasive procedure that uses shock waves to break up kidney stones.  Surgery may be needed if you have severe pain or persistent obstruction. There are various surgical procedures. Most of the procedures are performed with the use of small instruments. Only small incisions are needed to accommodate these instruments, so recovery time is minimized. The size, location, and chemical composition are all important variables that will determine the proper choice of action for you. Talk to your health care provider to better understand your situation so that you will minimize the risk of injury to yourself and your kidney.  HOME CARE INSTRUCTIONS   Drink enough water and fluids to keep your urine clear or pale yellow. This will help you to pass the stone or stone fragments.  Strain  all urine through the provided strainer. Keep all particulate matter and stones for your health care provider to see. The stone causing the pain may be as small as a grain of salt. It is very important to use the strainer each and every time you pass your urine. The collection of your stone will allow your health care provider to analyze it and verify that a stone has actually passed. The stone analysis will often identify what you can do to reduce the incidence of recurrences.  Only take over-the-counter or prescription medicines for pain, discomfort, or fever as directed by your health care provider.  Make a follow-up appointment with your health care provider as directed.  Get follow-up X-rays if required. The absence of pain does not always mean that the stone has passed. It may have only stopped moving. If the urine remains completely obstructed, it can cause loss of kidney function or even complete destruction of the kidney. It is your responsibility to make sure X-rays and follow-ups are completed. Ultrasounds of the kidney can show blockages and the status of the kidney. Ultrasounds are not associated with any radiation and can be performed easily in a matter of minutes. SEEK MEDICAL CARE IF:  You experience pain that is progressive and unresponsive to any pain medicine you have been prescribed. SEEK IMMEDIATE MEDICAL CARE IF:   Pain cannot be controlled with the prescribed medicine.  You have a fever or shaking chills.  The severity or intensity of pain increases over 18 hours and is not relieved by pain medicine.  You develop a new onset of abdominal pain.  You feel faint or pass out.  You are unable to urinate. MAKE SURE YOU:   Understand these instructions.  Will watch your condition.  Will get help right away if you are not doing well or get worse. Document Released: 12/11/2005 Document Revised: 08/13/2013 Document Reviewed: 05/14/2013 [facility] Patient Information 2015  [place], [place]. This information is not intended to replace advice given to you by your health care provider. Make sure you discuss any questions you have with your health care provider.  Ankle Exercises for Rehabilitation Following ankle injuries, it is as important to follow your caregiver's instructions for regaining full use of your ankle as it was to follow the initial treatment plan following the injury. The following are some suggestions for exercises and treatment, which can be done to help you regain full use of your ankle as soon as possible.  Follow all instructions regarding physical therapy.  Before exercising, it may be helpful to use heat on the muscles or joint being exercised. This loosens up the muscles and tendons (cordlike structure) and decreases chances of injury during your exercises. If this is not possible, just begin your exercises slowly to gradually warm up.  Stand on your toes several times per day to strengthen the calf muscles. These are the muscles in the back of your leg between the knee and the heel. The cord you can feel just above the heel is the Achilles tendon. Rise up on your toes several times repeating this three to four times per day. Do not exercise to the point of pain. If pain starts to develop, decrease the exercise until you are comfortable again.  Do range of motion exercises. This means moving the ankle in all directions. Practice writing the alphabet with your toes in the air. Do not increase beyond a range that is comfortable.  Increase the strength of the muscles in the front of your leg by raising your toes and foot straight up in the air. Repeat this exercise as you did the calf exercise with the same warnings. This also help to stretch your muscles.  Stretch your calf muscles also by leaning against a wall with your hands in front of you. Put your feet a few feet from the wall and bend your knees until you feel the muscles in your calves become  tight.  After exercising it may be helpful to put ice on the ankle to prevent swelling and improve rehabilitation. This may be done for 15 to 20 minutes following your exercises. If exercising is being done in the workplace, this may not always be possible.  Taping an ankle injury may be helpful to give added support following an injury. It also may help prevent reinjury. This may be true if you are in training or in a conditioning program. You and your caregiver can decide on the best course of action to follow. Document Released: 12/08/2000 Document Revised: 04/27/2014 Document Reviewed: 12/05/2008 [facility] Patient Information 2015 [place], [place]. This information is not intended to replace advice given to you by your health care provider. Make sure you discuss any questions you have with your health care provider. Acute Ankle Sprain with Phase II Rehab An acute ankle sprain is a partial or complete tear in one or more of the ligaments of the ankle due to traumatic injury. The severity of the injury depends on both the number of ligaments sprained and the grade of sprain. There are 3 grades of sprains.  A grade 1 sprain is a mild sprain. There is a slight pull  without obvious tearing. There is no loss of strength, and the muscle and ligament are the correct length.  A grade 2 sprain is a moderate sprain. There is tearing of fibers within the substance of the ligament where it connects two bones or two cartilages. The length of the ligament is increased, and there is usually decreased strength.  A grade 3 sprain is a complete rupture of the ligament and is uncommon. In addition to the grade of sprain, there are 3 types of ankle sprains.  Lateral ankle sprains. This is a sprain of one or more of the 3 ligaments on the outer side (lateral) of the ankle. These are the most common sprains. Medial ankle sprains. There is one large triangular ligament on the inner side (medial) of the ankle that is  susceptible to injury. Medial ankle sprains are less common. Syndesmosis, "high ankle," sprains. The syndesmosis is the ligament that connects the two bones of the lower leg. Syndesmosis sprains usually only occur with very severe ankle sprains. SYMPTOMS  Pain, tenderness, and swelling in the ankle, starting at the side of injury that may progress to the whole ankle and foot with time.  "Pop" or tearing sensation at the time of injury.  Bruising that may spread to the heel.  Impaired ability to walk soon after injury. CAUSES   Acute ankle sprains are caused by trauma placed on the ankle that temporarily forces or pries the anklebone (talus) out of its normal socket.  Stretching or tearing of the ligaments that normally hold the joint in place (usually due to a twisting injury). RISK INCREASES WITH:  Previous ankle sprain.  Sports in which the foot may land awkwardly (basketball, volleyball, soccer) or walking or running on uneven or rough surfaces.  Shoes with inadequate support to prevent sideways motion when stress occurs.  Poor strength and flexibility.  Poor balance skills.  Contact sports. PREVENTION  Warm up and stretch properly before activity.  Maintain physical fitness:  Ankle and leg flexibility, muscle strength, and endurance.  Cardiovascular fitness.  Balance training activities.  Use proper technique and have a coach correct improper technique.  Taping, protective strapping, bracing, or high-top tennis shoes may help prevent injury. Initially, tape is best. However, it loses most of its support function within 10 to 15 minutes.  Wear proper fitted protective shoes. Combining high-top shoes with taping or bracing is more effective than using either alone.  Provide the ankle with support during sports and practice activities for 12 months following injury. PROGNOSIS   If treated properly, ankle sprains can be expected to recover completely. However, the  length of recovery depends on the degree of injury.  A grade 1 sprain usually heals enough in 5 to 7 days to allow modified activity and requires an average of 6 weeks to heal completely.  A grade 2 sprain requires 6 to 10 weeks to heal completely.  A grade 3 sprain requires 12 to 16 weeks to heal.  A syndesmosis sprain often takes more than 3 months to heal. RELATED COMPLICATIONS   Frequent recurrence of symptoms may result in a chronic problem. Appropriately addressing the problem the first time decreases the frequency of recurrence and optimizes healing time. Severity of initial sprain does not predict the likelihood of later instability.  Injury to other structures (bone, cartilage, or tendon).  Chronically unstable or arthritic ankle joint are possible with repeated sprains. TREATMENT Treatment initially involves the use of ice, medicine, and compression bandages to help reduce  pain and inflammation. Ankle sprains are usually immobilized in a walking cast or boot to allow for healing. Crutches may be recommended to reduce pressure on the injury. After immobilization, strengthening and stretching exercises may be necessary to regain strength and a full range of motion. Surgery is rarely needed to treat ankle sprains. MEDICATION   Nonsteroidal anti-inflammatory medicines, such as aspirin and ibuprofen (do not take for the first 3 days after injury or within 7 days before surgery), or other minor pain relievers, such as acetaminophen, are often recommended. Take these as directed by your caregiver. Contact your caregiver immediately if any bleeding, stomach upset, or signs of an allergic reaction occur from these medicines.  Ointments applied to the skin may be helpful.  Pain relievers may be prescribed as necessary by your caregiver. Do not take prescription pain medicine for longer than 4 to 7 days. Use only as directed and only as much as you need. HEAT AND COLD  Cold treatment  (icing) is used to relieve pain and reduce inflammation for acute and chronic cases. Cold should be applied for 10 to 15 minutes every 2 to 3 hours for inflammation and pain and immediately after any activity that aggravates your symptoms. Use ice packs or an ice massage.  Heat treatment may be used before performing stretching and strengthening activities prescribed by your caregiver. Use a heat pack or a warm soak. SEEK IMMEDIATE MEDICAL CARE IF:   Pain, swelling, or bruising worsens despite treatment.  You experience pain, numbness, discoloration, or coldness in the foot or toes.  New, unexplained symptoms develop. (Drugs used in treatment may produce side effects.) EXERCISES  PHASE II EXERCISES RANGE OF MOTION (ROM) AND STRETCHING EXERCISES - Ankle Sprain, Acute-Phase II, Weeks 3 to 4 After your physician, physical therapist, or athletic trainer feels your knee has made progress significant enough to begin more advanced exercises, he or she may recommend completing some of the following exercises. Although each person heals at different rates, most people will be ready for these exercises between 3 and 4 weeks after their injury. Do not begin these exercises until you have your caregiver's permission. He or she may also advise you to continue with the exercises which you completed in Phase I of your rehabilitation. While completing these exercises, remember:   Restoring tissue flexibility helps normal motion to return to the joints. This allows healthier, less painful movement and activity.  An effective stretch should be held for at least 30 seconds.  A stretch should never be painful. You should only feel a gentle lengthening or release in the stretched tissue. RANGE OF MOTION - Ankle Plantar Flexion   Sit with your right / left leg crossed over your opposite knee.  Use your opposite hand to pull the top of your foot and toes toward you.  You should feel a gentle stretch on the top of  your foot/ankle. Hold this position for __________. Repeat __________ times. Complete __________ times per day.  RANGE OF MOTION - Ankle Eversion  Sit with your right / left ankle crossed over your opposite knee.  Grip your foot with your opposite hand, placing your thumb on the top of your foot and your fingers across the bottom of your foot.  Gently push your foot downward with a slight rotation so your littlest toes rise slightly  You should feel a gentle stretch on the inside of your ankle. Hold the stretch for __________ seconds. Repeat __________ times. Complete this exercise __________ times  per day.  RANGE OF MOTION - Ankle Inversion  Sit with your right / left ankle crossed over your opposite knee.  Grip your foot with your opposite hand, placing your thumb on the bottom of your foot and your fingers across the top of your foot.  Gently pull your foot so the smallest toe comes toward you and your thumb pushes the inside of the ball of your foot away from you.  You should feel a gentle stretch on the outside of your ankle. Hold the stretch for __________ seconds. Repeat __________ times. Complete this exercise __________ times per day.  STRETCH - Gastrocsoleus  Sit with your right / left leg extended. Holding onto both ends of a belt or towel, loop it around the ball of your foot.  Keeping your right / left ankle and foot relaxed and your knee straight, pull your foot and ankle toward you using the belt/towel.  You should feel a gentle stretch behind your calf or knee. Hold this position for __________ seconds. Repeat __________ times. Complete this stretch __________ times per day.  RANGE OF MOTION - Ankle Dorsiflexion, Active Assisted  Remove shoes and sit on a chair that is preferably not on a carpeted surface.  Place right / left foot under knee. Extend your opposite leg for support.  Keeping your heel down, slide your right / left foot back toward the chair until you  feel a stretch at your ankle or calf. If you do not feel a stretch, slide your bottom forward to the edge of the chair while still keeping your heel down.  Hold this stretch for __________ seconds. Repeat __________ times. Complete this stretch __________ times per day.  STRETCH - Gastroc, Standing   Place hands on wall.  Extend right / left leg and place a folded washcloth under the arch of your foot for support. Keep the front knee somewhat bent.  Slightly point your toes inward on your back foot.  Keeping your right / left heel on the floor and your knee straight, shift your weight toward the wall, not allowing your back to arch.  You should feel a gentle stretch in the calf. Hold this position for __________ seconds. Repeat __________ times. Complete this stretch __________ times per day. STRETCH - Soleus, Standing  Place hands on wall.  Extend right / left leg and place a folded washcloth under the arch of your foot for support. Keep the front knee somewhat bent.  Slightly point your toes inward on your back foot.  Keep your right / left heel on the floor, bend your back knee, and slightly shift your weight over the back leg so that you feel a gentle stretch deep in your back calf.  Hold this position for __________ seconds. Repeat __________ times. Complete this stretch __________ times per day. STRETCH - Gastrocsoleus, Standing Note: This exercise can place a lot of stress on your foot and ankle. Please complete this exercise only if specifically instructed by your caregiver.   Place the ball of your right / left foot on a step, keeping your other foot firmly on the same step.  Hold on to the wall or a rail for balance.  Slowly lift your other foot, allowing your body weight to press your heel down over the edge of the step.  You should feel a stretch in your right / left calf.  Hold this position for __________ seconds.  Repeat this exercise with a slight bend in your  knee.  Repeat __________ times. Complete this stretch __________ times per day.  STRENGTHENING EXERCISES - Ankle Sprain, Acute-Phase II Around 3 to 4 weeks after your injury, you may progress to some of these exercises in your rehabilitation program. Do not begin these until you have your caregiver's permission. Although your condition has improved, the Phase I exercises will continue to be helpful and you may continue to complete them. As you complete strengthening exercises, remember:   Strong muscles with good endurance tolerate stress better.  Do the exercises as initially prescribed by your caregiver. Progress slowly with each exercise, gradually increasing the number of repetitions and weight used under his or her guidance.  You may experience muscle soreness or fatigue, but the pain or discomfort you are trying to eliminate should never worsen during these exercises. If this pain does worsen, stop and make certain you are following the directions exactly. If the pain is still present after adjustments, discontinue the exercise until you can discuss the trouble with your caregiver. STRENGTH - Plantar-flexors, Standing  Stand with your feet shoulder width apart. Steady yourself with a wall or table using as little support as needed.  Keeping your weight evenly spread over the width of your feet, rise up on your toes.*  Hold this position for __________ seconds. Repeat __________ times. Complete this exercise __________ times per day.  *If this is too easy, shift your weight toward your right / left leg until you feel challenged. Ultimately, you may be asked to do this exercise with your right / left foot only. STRENGTH - Dorsiflexors and Plantar-flexors, Heel/toe Walking  Dorsiflexion: Walk on your heels only. Keep your toes as high as possible.  Walk for ____________________ seconds/feet.  Repeat __________ times. Complete __________ times per day.  Plantar flexion: Walk on your toes  only. Keep your heels as high as possible.  Walk for ____________________ seconds/feet. Repeat __________ times. Complete __________ times per day.  BALANCE - Tandem Walking  Place your uninjured foot on a line 2 to 4 inches wide and at least 10 feet long.  Keeping your balance without using anything for extra support, place your right / left heel directly in front of your other foot.  Slowly raise your back foot up, lifting from the heel to the toes, and place it directly in front of the right / left foot.  Continue to walk along the line slowly. Walk for ____________________ feet. Repeat ____________________ times. Complete ____________________ times per day. BALANCE - Inversion/Eversion Use caution, these are advanced level exercises. Do not begin them until you are advised to do so.   Create a balance board using a sturdy board about 1  feet long and at 1 to 1  feet wide and a 1  inch diameter rod or pipe that is as long as the board's width. A copper pipe or a solid broomstick work well.  Stand on a non-carpeted surface near a countertop or wall. Step onto the board so that your feet are hip-width apart and equally straddle the rod/pipe.  Keeping your feet in place, complete these two exercises without shifting your upper body or hips:  Tip the board from side-to-side. Control the movement so the board does not forcefully strike the ground. The board should silently tap the ground.  Tip the board side-to-side without striking the ground. Occasionally pause and maintain a steady position at various points.  Repeat the first two exercises, but use only your right / left foot. Place your right /  left foot directly over the rod/pipe. Repeat __________ times. Complete this exercise __________ times a day. BALANCE - Plantar/Dorsi Flexion Use caution, these are advanced level exercises. Do not begin them until you are advised to do so.   Create a balance board using a sturdy board  about 1  feet long and at 1 to 1  feet wide and a 1  inch diameter rod or pipe that is as long as the board's width. A copper pipe or a solid broomstick work well.  Stand on a non-carpeted surface near a countertop or wall. Stand on the board so that the rod/pipe runs under the arches in your feet.  Keeping your feet in place, complete these two exercises without shifting your upper body or hips:  Tip the board from side-to-side. Control the movement so the board does not forcefully strike the ground. The board should silently tap the ground.  Tip the board side-to-side without striking the ground. Occasionally pause and maintain a steady position at various points.  Repeat the first two exercises, but use only your right / left foot. Stand in the center of the board. Repeat __________ times. Complete this exercise __________ times a day. STRENGTH - Plantar-flexors, Eccentric Note: This exercise can place a lot of stress on your foot and ankle. Please complete this exercise only if specifically instructed by your caregiver.   Place the balls of your feet on a step. With your hands, use only enough support from a wall or rail to keep your balance.  Keep your knees straight and rise up on your toes.  Slowly shift your weight entirely to your toes and pick up your opposite foot. Gently and with controlled movement, lower your weight through your right / left foot so that your heel drops below the level of the step. You will feel a slight stretch in the back of your calf at the ending position.  Use the healthy leg to help rise up onto the balls of both feet, then lower weight only on the right / left leg again. Build up to 15 repetitions. Then progress to 3 consecutive sets of 15 repetitions.*  After completing the above exercise, complete the same exercise with a slight knee bend (about 30 degrees). Again, build up to 15 repetitions. Then progress to 3 consecutive sets of 15  repetitions.* Perform this exercise __________ times per day.  *When you easily complete 3 sets of 15, your physician, physical therapist, or athletic trainer may advise you to add resistance by wearing a backpack filled with additional weight. Document Released: 04/02/2006 Document Revised: 03/04/2012 Document Reviewed: 03/25/2009 [facility] Patient Information 2015 [place], [place]. This information is not intended to replace advice given to you by your health care provider. Make sure you discuss any questions you have with your health care provider.

## 2014-07-21 NOTE — Progress Notes (Signed)
History and physical examinations reviewed with Georgiann Mccoy, PA-C.  Agree with Assessment and Plan.

## 2014-07-22 LAB — URINE CULTURE

## 2014-07-23 ENCOUNTER — Encounter: Payer: Self-pay | Admitting: Family Medicine

## 2014-07-30 ENCOUNTER — Ambulatory Visit (INDEPENDENT_AMBULATORY_CARE_PROVIDER_SITE_OTHER): Payer: BC Managed Care – PPO

## 2014-07-30 ENCOUNTER — Ambulatory Visit (INDEPENDENT_AMBULATORY_CARE_PROVIDER_SITE_OTHER): Payer: BC Managed Care – PPO | Admitting: Family Medicine

## 2014-07-30 VITALS — BP 120/78 | HR 78 | Temp 98.1°F | Resp 18 | Ht 64.0 in | Wt 226.8 lb

## 2014-07-30 DIAGNOSIS — R1032 Left lower quadrant pain: Secondary | ICD-10-CM

## 2014-07-30 DIAGNOSIS — R310 Gross hematuria: Secondary | ICD-10-CM

## 2014-07-30 DIAGNOSIS — N200 Calculus of kidney: Secondary | ICD-10-CM

## 2014-07-30 DIAGNOSIS — R110 Nausea: Secondary | ICD-10-CM

## 2014-07-30 LAB — POCT URINALYSIS DIPSTICK
Glucose, UA: NEGATIVE
Ketones, UA: 15
Nitrite, UA: NEGATIVE
Protein, UA: >=300
Spec Grav, UA: 1.02
Urobilinogen, UA: 1
pH, UA: 5.5

## 2014-07-30 LAB — POCT CBC
Granulocyte percent: 78.1 %G (ref 37–80)
HCT, POC: 35.7 % — AB (ref 37.7–47.9)
Hemoglobin: 11.3 g/dL — AB (ref 12.2–16.2)
Lymph, poc: 2.4 (ref 0.6–3.4)
MCH, POC: 28.7 pg (ref 27–31.2)
MCHC: 31.7 g/dL — AB (ref 31.8–35.4)
MCV: 90.8 fL (ref 80–97)
MID (cbc): 0.4 (ref 0–0.9)
MPV: 8.4 fL (ref 0–99.8)
POC Granulocyte: 9.9 — AB (ref 2–6.9)
POC LYMPH PERCENT: 19 % (ref 10–50)
POC MID %: 2.9 %M (ref 0–12)
Platelet Count, POC: 297 10*3/uL (ref 142–424)
RBC: 3.93 M/uL — AB (ref 4.04–5.48)
RDW, POC: 13.8 %
WBC: 12.7 10*3/uL — AB (ref 4.6–10.2)

## 2014-07-30 LAB — POCT UA - MICROSCOPIC ONLY
Casts, Ur, LPF, POC: NEGATIVE
Crystals, Ur, HPF, POC: NEGATIVE
Yeast, UA: NEGATIVE

## 2014-07-30 MED ORDER — KETOROLAC TROMETHAMINE 60 MG/2ML IM SOLN
60.0000 mg | Freq: Once | INTRAMUSCULAR | Status: AC
  Administered 2014-07-30: 60 mg via INTRAMUSCULAR

## 2014-07-30 MED ORDER — ONDANSETRON 4 MG PO TBDP
4.0000 mg | ORAL_TABLET | Freq: Once | ORAL | Status: AC
  Administered 2014-07-30: 4 mg via ORAL

## 2014-07-30 MED ORDER — OXYCODONE-ACETAMINOPHEN 5-325 MG PO TABS: 1.0000 | ORAL_TABLET | Freq: Three times a day (TID) | ORAL | Status: DC | PRN

## 2014-07-30 NOTE — Progress Notes (Signed)
Chief Complaint:  Chief Complaint  Patient presents with  . Kidney stones    follow up--pain got worse    HPI: Dawn Dunn is a 28 y.o. female who is here for recheck of kidney stone, she is worried because her left sided abd pain was moving and she states it stopped, no where pain is worse, and she has gross blood in her urine and she can't urinate. She would like a recheck. She has been pushing fluids, drank a liter of water prior to coming in. She has no fevers or chills but does have nause, no emesis. Wants rechecks. Taking flomax, running out of pain meds.   CLINICAL DATA: Kidney stone.  EXAM:  ABDOMEN - 1 VIEW  COMPARISON: 07/16/2014 and 11/06/2013  FINDINGS:  There is a faint 4 mm density to the left of midline at the L2-3  level which could be a stone in the proximal left ureter. There is a  2 mm stone in the lower pole the right kidney.  The bowel gas pattern is normal. No osseous abnormality.  IMPRESSION:  Possible 4 mm stone in the proximal left ureter. Stable small stone  in the lower pole of the right kidney.  Electronically Signed  By: Rozetta Nunnery M.D.  On: 07/20/2014 21:27   Past Medical History  Diagnosis Date  . Kidney stones   . Anemia   . Fibromyalgia   . Mild cervical dysplasia   . Asthma   . Allergy   . Depression    Past Surgical History  Procedure Laterality Date  . Right ankle fracture    . Wisdom tooth extraction    . Cystoscopy    . Cervical cyrosurgery     History   Social History  . Marital Status: Single    Spouse Name: N/A    Number of Children: N/A  . Years of Education: N/A   Social History Main Topics  . Smoking status: Current Every Day Smoker -- 1.00 packs/day for 8 years    Last Attempt to Quit: 11/25/2012  . Smokeless tobacco: Never Used  . Alcohol Use: Yes     Comment: rarely  . Drug Use: No  . Sexual Activity: No   Other Topics Concern  . None   Social History Narrative   Patient is an Science writer. She exercises walking 1-2 times weekly for 30 minutes.   Family History  Problem Relation Age of Onset  . Diabetes Father   . Depression Father   . Hyperlipidemia Father    Allergies  Allergen Reactions  . Hydrocodone-Acetaminophen Rash    Only allergic to [employer]; Hydrocodone tablets are ok   Prior to Admission medications   Medication Sig Start Date End Date Taking? Authorizing Provider  betamethasone valerate ointment (VALISONE) 0.1 % Apply 1 application topically 2 (two) times daily. 06/26/14  Yes Shawnee Knapp, MD  carisoprodol (SOMA) 350 MG tablet Take 1 tablet (350 mg total) by mouth 4 (four) times daily as needed for muscle spasms. 06/26/14  Yes Shawnee Knapp, MD  cephALEXin (KEFLEX) 500 MG capsule Take 1 capsule (500 mg total) by mouth 4 (four) times daily. 07/20/14  Yes Shawnee Knapp, MD  cholecalciferol (VITAMIN D) 1000 UNITS tablet Take 1,000 Units by mouth daily.   Yes Historical Provider, MD  fluticasone (FLONASE) 50 MCG/ACT nasal spray Place 2 sprays into both nostrils daily. 03/10/14  Yes Heather M Marte, PA-C  ibuprofen (ADVIL,MOTRIN) 800 MG tablet  Take 1 tablet (800 mg total) by mouth every 8 (eight) hours as needed. 07/16/14  Yes Heather M Marte, PA-C  ketorolac (TORADOL) 10 MG tablet Take 1 tablet (10 mg total) by mouth every 6 (six) hours as needed. 07/20/14  Yes Shawnee Knapp, MD  montelukast (SINGULAIR) 10 MG tablet Take 1 tablet (10 mg total) by mouth at bedtime. 06/26/14  Yes Shawnee Knapp, MD  omeprazole (PRILOSEC) 40 MG capsule Take 1 capsule (40 mg total) by mouth daily. 06/26/14  Yes Shawnee Knapp, MD  ondansetron (ZOFRAN ODT) 8 MG disintegrating tablet Take 1 tablet (8 mg total) by mouth every 8 (eight) hours as needed for nausea or vomiting. 07/20/14  Yes Shawnee Knapp, MD  oxyCODONE-acetaminophen (ROXICET) 5-325 MG per tablet Take 1 tablet by mouth every 8 (eight) hours as needed for severe pain. 07/20/14  Yes Shawnee Knapp, MD  tamsulosin (FLOMAX) 0.4 MG CAPS capsule Take 1  capsule (0.4 mg total) by mouth daily. 07/16/14  Yes Heather M Marte, PA-C  terbinafine (LAMISIL) 250 MG tablet Take 1 tablet (250 mg total) by mouth daily. 11/28/13  Yes Shawnee Knapp, MD     ROS: The patient denies fevers, chills, night sweats, unintentional weight loss, chest pain, palpitations, wheezing, dyspnea on exertion, , dysuria,melena, numbness, weakness, or tingling.   All other systems have been reviewed and were otherwise negative with the exception of those mentioned in the HPI and as above.    PHYSICAL EXAM: Filed Vitals:   07/30/14 1530  BP: 120/78  Pulse: 78  Temp: 98.1 F (36.7 C)  Resp: 18   Filed Vitals:   07/30/14 1530  Height: 5\' 4"  (1.626 m)  Weight: 226 lb 12.8 oz (102.876 kg)   Body mass index is 38.91 kg/(m^2).  General: Alert, no acute distress, obese HEENT:  Normocephalic, atraumatic, oropharynx patent. EOMI, PERRLA Cardiovascular:  Regular rate and rhythm, no rubs murmurs or gallops.  No Carotid bruits, radial pulse intact. No pedal edema.  Respiratory: Clear to auscultation bilaterally.  No wheezes, rales, or rhonchi.  No cyanosis, no use of accessory musculature GI: No organomegaly, abdomen is soft and  + minimal left sided tenderness abd, positive bowel sounds.  No masses. Skin: No rashes. Neurologic: Facial musculature symmetric. Psychiatric: Patient is appropriate throughout our interaction. Lymphatic: No cervical lymphadenopathy Musculoskeletal: Gait intact.   LABS: Results for orders placed in visit on 07/30/14  POCT CBC      Result Value Ref Range   WBC 12.7 (*) 4.6 - 10.2 K/uL   Lymph, poc 2.4  0.6 - 3.4   POC LYMPH PERCENT 19.0  10 - 50 %L   MID (cbc) 0.4  0 - 0.9   POC MID % 2.9  0 - 12 %M   POC Granulocyte 9.9 (*) 2 - 6.9   Granulocyte percent 78.1  37 - 80 %G   RBC 3.93 (*) 4.04 - 5.48 M/uL   Hemoglobin 11.3 (*) 12.2 - 16.2 g/dL   HCT, POC 35.7 (*) 37.7 - 47.9 %   MCV 90.8  80 - 97 fL   MCH, POC 28.7  27 - 31.2 pg   MCHC 31.7  (*) 31.8 - 35.4 g/dL   RDW, POC 13.8     Platelet Count, POC 297  142 - 424 K/uL   MPV 8.4  0 - 99.8 fL  POCT URINALYSIS DIPSTICK      Result Value Ref Range   Color, UA brown  Clarity, UA cloudy     Glucose, UA neg     Bilirubin, UA small     Ketones, UA 15     Spec Grav, UA 1.020     Blood, UA large     pH, UA 5.5     Protein, UA >=300     Urobilinogen, UA 1.0     Nitrite, UA neg     Leukocytes, UA Trace    POCT UA - MICROSCOPIC ONLY      Result Value Ref Range   WBC, Ur, HPF, POC 0-1     RBC, urine, microscopic tntc     Bacteria, U Microscopic trace     Mucus, UA small     Epithelial cells, urine per micros 4-6     Crystals, Ur, HPF, POC neg     Casts, Ur, LPF, POC neg     Yeast, UA neg       EKG/XRAY:   Primary read interpreted by Dr. Marin Comment at [facility]. bialteral kidney stones, neg for hydronephrosis   ASSESSMENT/PLAN: Encounter Diagnoses  Name Primary?  . Abdominal pain, left lower quadrant Yes  . Gross hematuria   . Nausea alone   . Kidney stone    Pleasant 28 y.o femal with a history of persistent bilateral renal stones, < 5 mm on last abd xray, with worsening left sided abd /flank pain and gross hematuria and  Inability to urinate. She was given Toradol and also 2 L of IVF and felt better. Was able to urinate in office and urine was more clear.  Rx percocet, c/w flomax Push IVF, has strainer F/u prn  Gross sideeffects, risk and benefits, and alternatives of medications d/w patient. Patient is aware that all medications have potential sideeffects and we are unable to predict every sideeffect or drug-drug interaction that may occur.  Jahmad Petrich, [place], DO 07/30/2014 6:46 PM    CLINICAL DATA: Abdominal pain with history of kidney stones  EXAM:  ABDOMEN - 1 VIEW  COMPARISON: 07/20/2014  FINDINGS:  Normal bowel gas pattern. Tiny stone over lower pole right kidney  stable. 3 mm stone over midpole left kidney. Very subtle 3 mm  calcific density to the left of  the spine at the L2-3 level similar  to prior study possibly representing a ureteral stone.  IMPRESSION:  As seen previously, there are small renal and possibly left ureteral  calculi.  Electronically Signed  By: Skipper Cliche M.D.  On: 07/30/2014 17:22

## 2014-07-31 LAB — COMPLETE METABOLIC PANEL WITH GFR
ALT: 17 U/L (ref 0–35)
AST: 17 U/L (ref 0–37)
Albumin: 4.3 g/dL (ref 3.5–5.2)
Alkaline Phosphatase: 104 U/L (ref 39–117)
Calcium: 8.7 mg/dL (ref 8.4–10.5)
Chloride: 99 mEq/L (ref 96–112)
GFR, Est Non African American: >89 mL/min
Potassium: 4.2 mEq/L (ref 3.5–5.3)
Sodium: 133 mEq/L — ABNORMAL LOW (ref 135–145)

## 2014-07-31 LAB — COMPLETE METABOLIC PANEL WITHOUT GFR
BUN: 14 mg/dL (ref 6–23)
CO2: 23 meq/L (ref 19–32)
Creat: 0.85 mg/dL (ref 0.50–1.10)
GFR, Est African American: >89 mL/min
Glucose, Bld: 92 mg/dL (ref 70–99)
Total Bilirubin: 0.2 mg/dL (ref 0.2–1.2)
Total Protein: 6.8 g/dL (ref 6.0–8.3)

## 2014-07-31 LAB — URINE CULTURE

## 2014-08-06 ENCOUNTER — Encounter: Payer: Self-pay | Admitting: Family Medicine

## 2014-08-17 ENCOUNTER — Ambulatory Visit (INDEPENDENT_AMBULATORY_CARE_PROVIDER_SITE_OTHER): Payer: BC Managed Care – PPO | Admitting: Emergency Medicine

## 2014-08-17 VITALS — BP 118/76 | HR 103 | Temp 98.7°F | Resp 20 | Ht 64.0 in | Wt 231.0 lb

## 2014-08-17 DIAGNOSIS — N200 Calculus of kidney: Secondary | ICD-10-CM

## 2014-08-17 DIAGNOSIS — R109 Unspecified abdominal pain: Secondary | ICD-10-CM

## 2014-08-17 LAB — POCT UA - MICROSCOPIC ONLY
CASTS, UR, LPF, POC: NEGATIVE
CRYSTALS, UR, HPF, POC: NEGATIVE
Mucus, UA: POSITIVE
YEAST UA: NEGATIVE

## 2014-08-17 LAB — POCT URINALYSIS DIPSTICK
BILIRUBIN UA: NEGATIVE
Glucose, UA: NEGATIVE
Ketones, UA: NEGATIVE
Leukocytes, UA: NEGATIVE
Nitrite, UA: NEGATIVE
Protein, UA: 30
Spec Grav, UA: >=1.03
Urobilinogen, UA: 1
pH, UA: 5.5

## 2014-08-17 MED ORDER — OXYCODONE-ACETAMINOPHEN 5-325 MG PO TABS: 1.0000 | ORAL_TABLET | ORAL | Status: DC | PRN

## 2014-08-17 NOTE — Progress Notes (Signed)
[facility] [address] [place]  [address] [phone]  Date:  08/17/2014   Name:  Dawn Dunn   DOB:  [birth year]   MRN:  [id number]  PCP:  Delman Cheadle, MD    Chief Complaint: Flank Pain, Urinary Frequency and Dysuria   History of Present Illness:  Dawn Dunn is a 28 y.o. very pleasant female patient who presents with the following:  Patient has been seen repeatedly for what appear to be 3 distinct small stones.  She is under the impression they are "too small to do anything".  She has come in today with recurrent pain. No nausea or vomiting.  No stool change.  No blood in urine this event Has vicodin at home that is not helping her pain. Has not seen a urologist in 10 years. No improvement with over the counter medications or other home remedies.  Denies other complaint or health concern today.   Patient Active Problem List   Diagnosis Date Noted  . Arthralgia 11/28/2013  . Myalgia and myositis 11/28/2013  . Onychomycosis 11/28/2013  . GERD (gastroesophageal reflux disease) 11/28/2013  . Synovitis of hand 02/11/2013  . Fibromyalgia 01/17/2013  . Kidney stones 02/05/2012    Past Medical History  Diagnosis Date  . Kidney stones   . Anemia   . Fibromyalgia   . Mild cervical dysplasia   . Asthma   . Allergy   . Depression     Past Surgical History  Procedure Laterality Date  . Right ankle fracture    . Wisdom tooth extraction    . Cystoscopy    . Cervical cyrosurgery      History  Substance Use Topics  . Smoking status: Current Every Day Smoker -- 1.00 packs/day for 8 years    Last Attempt to Quit: 11/25/2012  . Smokeless tobacco: Never Used  . Alcohol Use: Yes     Comment: rarely    Family History  Problem Relation Age of Onset  . Diabetes Father   . Depression Father   . Hyperlipidemia Father     Allergies  Allergen Reactions  . Hydrocodone-Acetaminophen Rash    Only allergic to [employer]; Hydrocodone tablets  are ok    Medication list has been reviewed and updated.  Current Outpatient Prescriptions on File Prior to Visit  Medication Sig Dispense Refill  . betamethasone valerate ointment (VALISONE) 0.1 % Apply 1 application topically 2 (two) times daily.  90 g  1  . carisoprodol (SOMA) 350 MG tablet Take 1 tablet (350 mg total) by mouth 4 (four) times daily as needed for muscle spasms.  60 tablet  3  . cholecalciferol (VITAMIN D) 1000 UNITS tablet Take 1,000 Units by mouth daily.      . fluticasone (FLONASE) 50 MCG/ACT nasal spray Place 2 sprays into both nostrils daily.  16 g  6  . ibuprofen (ADVIL,MOTRIN) 800 MG tablet Take 1 tablet (800 mg total) by mouth every 8 (eight) hours as needed.  30 tablet  0  . ketorolac (TORADOL) 10 MG tablet Take 1 tablet (10 mg total) by mouth every 6 (six) hours as needed.  20 tablet  0  . montelukast (SINGULAIR) 10 MG tablet Take 1 tablet (10 mg total) by mouth at bedtime.  30 tablet  3  . omeprazole (PRILOSEC) 40 MG capsule Take 1 capsule (40 mg total) by mouth daily.  90 capsule  1  . ondansetron (ZOFRAN ODT) 8 MG disintegrating tablet Take 1 tablet (8  mg total) by mouth every 8 (eight) hours as needed for nausea or vomiting.  20 tablet  0  . oxyCODONE-acetaminophen (ROXICET) 5-325 MG per tablet Take 1 tablet by mouth every 8 (eight) hours as needed for severe pain.  20 tablet  0   No current facility-administered medications on file prior to visit.    Review of Systems:  As per HPI, otherwise negative.    Physical Examination: Filed Vitals:   08/17/14 1827  BP: 118/76  Pulse: 103  Temp: 98.7 F (37.1 C)  Resp: 20   Filed Vitals:   08/17/14 1827  Height: 5\' 4"  (1.626 m)  Weight: 231 lb (104.781 kg)   Body mass index is 39.63 kg/(m^2). Ideal Body Weight: Weight in (lb) to have BMI = 25: 145.3   GEN: WDWN, NAD, Non-toxic, Alert & Oriented x 3 HEENT: Atraumatic, Normocephalic.  Ears and Nose: No external deformity. EXTR: No  clubbing/cyanosis/edema NEURO: Normal gait.  PSYCH: Normally interactive. Conversant. Not depressed or anxious appearing.  Calm demeanor.    Assessment and Plan: Bilateral stones Recommended a referral to urology due to her frequent visits Oxycodone  Signed,  Ellison Carwin, MD

## 2014-08-19 ENCOUNTER — Other Ambulatory Visit: Payer: Self-pay | Admitting: Family Medicine

## 2014-08-19 DIAGNOSIS — N200 Calculus of kidney: Secondary | ICD-10-CM

## 2014-08-19 MED ORDER — TAMSULOSIN HCL 0.4 MG PO CAPS: 0.4000 mg | ORAL_CAPSULE | Freq: Every day | ORAL | Status: DC

## 2014-08-19 MED ORDER — KETOROLAC TROMETHAMINE 10 MG PO TABS: 10.0000 mg | ORAL_TABLET | Freq: Four times a day (QID) | ORAL | Status: DC | PRN

## 2014-09-14 ENCOUNTER — Ambulatory Visit (INDEPENDENT_AMBULATORY_CARE_PROVIDER_SITE_OTHER): Payer: No Typology Code available for payment source

## 2014-09-14 ENCOUNTER — Ambulatory Visit (INDEPENDENT_AMBULATORY_CARE_PROVIDER_SITE_OTHER): Payer: No Typology Code available for payment source | Admitting: Family Medicine

## 2014-09-14 VITALS — BP 128/80 | HR 88 | Temp 97.9°F | Resp 16 | Ht 64.75 in | Wt 230.4 lb

## 2014-09-14 DIAGNOSIS — R509 Fever, unspecified: Secondary | ICD-10-CM

## 2014-09-14 DIAGNOSIS — B078 Other viral warts: Secondary | ICD-10-CM

## 2014-09-14 DIAGNOSIS — J4521 Mild intermittent asthma with (acute) exacerbation: Secondary | ICD-10-CM

## 2014-09-14 DIAGNOSIS — R05 Cough: Secondary | ICD-10-CM

## 2014-09-14 DIAGNOSIS — R059 Cough, unspecified: Secondary | ICD-10-CM

## 2014-09-14 DIAGNOSIS — J209 Acute bronchitis, unspecified: Secondary | ICD-10-CM

## 2014-09-14 DIAGNOSIS — J45901 Unspecified asthma with (acute) exacerbation: Secondary | ICD-10-CM

## 2014-09-14 LAB — POCT CBC
Granulocyte percent: 61.4 %G (ref 37–80)
HCT, POC: 39.5 % (ref 37.7–47.9)
HEMOGLOBIN: 12.1 g/dL — AB (ref 12.2–16.2)
LYMPH, POC: 4.1 — AB (ref 0.6–3.4)
MCH, POC: 28.2 pg (ref 27–31.2)
MCHC: 30.5 g/dL — AB (ref 31.8–35.4)
MCV: 92.4 fL (ref 80–97)
MID (cbc): 0.6 (ref 0–0.9)
MPV: 8.4 fL (ref 0–99.8)
POC Granulocyte: 7.5 — AB (ref 2–6.9)
POC LYMPH PERCENT: 33.4 %L (ref 10–50)
POC MID %: 5.2 %M (ref 0–12)
Platelet Count, POC: 319 10*3/uL (ref 142–424)
RBC: 4.28 M/uL (ref 4.04–5.48)
RDW, POC: 17.2 %
WBC: 12.2 10*3/uL — AB (ref 4.6–10.2)

## 2014-09-14 MED ORDER — ALBUTEROL SULFATE HFA 108 (90 BASE) MCG/ACT IN AERS: 1.0000 | INHALATION_SPRAY | Freq: Four times a day (QID) | RESPIRATORY_TRACT | Status: AC | PRN

## 2014-09-14 MED ORDER — AZITHROMYCIN 500 MG PO TABS: 500.0000 mg | ORAL_TABLET | Freq: Every day | ORAL | Status: DC

## 2014-09-14 MED ORDER — HYDROCOD POLST-CHLORPHEN POLST 10-8 MG/5ML PO LQCR: 5.0000 mL | Freq: Two times a day (BID) | ORAL | Status: DC | PRN

## 2014-09-14 MED ORDER — METHYLPREDNISOLONE ACETATE 80 MG/ML IJ SUSP
120.0000 mg | Freq: Once | INTRAMUSCULAR | Status: AC
  Administered 2014-09-14: 120 mg via INTRAMUSCULAR

## 2014-09-14 MED ORDER — ALBUTEROL SULFATE (2.5 MG/3ML) 0.083% IN NEBU: 2.5000 mg | INHALATION_SOLUTION | Freq: Four times a day (QID) | RESPIRATORY_TRACT | Status: AC | PRN

## 2014-09-14 MED ORDER — ALBUTEROL SULFATE (2.5 MG/3ML) 0.083% IN NEBU
5.0000 mg | INHALATION_SOLUTION | Freq: Once | RESPIRATORY_TRACT | Status: AC
  Administered 2014-09-14: 5 mg via RESPIRATORY_TRACT

## 2014-09-14 NOTE — Patient Instructions (Signed)
Cryosurgery for Skin Conditions, Care After Refer to this sheet in the next few weeks. These instructions provide you with information on caring for yourself after your procedure. Your health care provider may also give you more specific instructions. Your treatment has been planned according to current medical practices, but problems sometimes occur. Call your health care provider if you have any problems or questions after your procedure. WHAT TO EXPECT AFTER THE PROCEDURE After your procedure, it is typical to have the following:  The treated area will become red and swollen shortly after the procedure.  Within 2-3 days, a blister will form over the treated area. The blister may contain a small amount of blood.  In about 2 weeks, the blister will break on its own, leaving a scab. The treated area will then heal. After healing, there is usually little or no scarring. HOME CARE INSTRUCTIONS   Keep the treated area clean, dry, and covered with a bandage until healed. The area can be cleaned as usual with soap and water.  You may take showers. If your bandage gets wet, change it right away.  Do not pick at your blister or try to break it open. This can cause infection and scarring.  Do not apply any medicine, cream, or lotion to the treated area unless directed to do so by your health care provider. SEEK MEDICAL CARE IF:   You have increased pain, swelling, redness, fluid drainage, or bleeding in the treated area.  Your blister becomes large and painful. Document Released: 06/30/2005 Document Revised: 08/13/2013 Document Reviewed: 07/11/2013 [facility] Patient Information 2015 [place], [place]. This information is not intended to replace advice given to you by your health care provider. Make sure you discuss any questions you have with your health care provider. Acute Bronchitis Bronchitis is inflammation of the airways that extend from the windpipe into the lungs (bronchi). The inflammation  often causes mucus to develop. This leads to a cough, which is the most common symptom of bronchitis.  In acute bronchitis, the condition usually develops suddenly and goes away over time, usually in a couple weeks. Smoking, allergies, and asthma can make bronchitis worse. Repeated episodes of bronchitis may cause further lung problems.  CAUSES Acute bronchitis is most often caused by the same virus that causes a cold. The virus can spread from person to person (contagious) through coughing, sneezing, and touching contaminated objects. SIGNS AND SYMPTOMS   Cough.   Fever.   Coughing up mucus.   Body aches.   Chest congestion.   Chills.   Shortness of breath.   Sore throat.  DIAGNOSIS  Acute bronchitis is usually diagnosed through a physical exam. Your health care provider will also ask you questions about your medical history. Tests, such as chest X-rays, are sometimes done to rule out other conditions.  TREATMENT  Acute bronchitis usually goes away in a couple weeks. Oftentimes, no medical treatment is necessary. Medicines are sometimes given for relief of fever or cough. Antibiotic medicines are usually not needed but may be prescribed in certain situations. In some cases, an inhaler may be recommended to help reduce shortness of breath and control the cough. A cool mist vaporizer may also be used to help thin bronchial secretions and make it easier to clear the chest.  HOME CARE INSTRUCTIONS  Get plenty of rest.   Drink enough fluids to keep your urine clear or pale yellow (unless you have a medical condition that requires fluid restriction). Increasing fluids may help thin your  respiratory secretions (sputum) and reduce chest congestion, and it will prevent dehydration.   Take medicines only as directed by your health care provider.  If you were prescribed an antibiotic medicine, finish it all even if you start to feel better.  Avoid smoking and secondhand smoke.  Exposure to cigarette smoke or irritating chemicals will make bronchitis worse. If you are a smoker, consider using nicotine gum or skin patches to help control withdrawal symptoms. Quitting smoking will help your lungs heal faster.   Reduce the chances of another bout of acute bronchitis by washing your hands frequently, avoiding people with cold symptoms, and trying not to touch your hands to your mouth, nose, or eyes.   Keep all follow-up visits as directed by your health care provider.  SEEK MEDICAL CARE IF: Your symptoms do not improve after 1 week of treatment.  SEEK IMMEDIATE MEDICAL CARE IF:  You develop an increased fever or chills.   You have chest pain.   You have severe shortness of breath.  You have bloody sputum.   You develop dehydration.  You faint or repeatedly feel like you are going to pass out.  You develop repeated vomiting.  You develop a severe headache. MAKE SURE YOU:   Understand these instructions.  Will watch your condition.  Will get help right away if you are not doing well or get worse. Document Released: 01/18/2005 Document Revised: 04/27/2014 Document Reviewed: 06/03/2013 [facility] Patient Information 2015 [place], [place]. This information is not intended to replace advice given to you by your health care provider. Make sure you discuss any questions you have with your health care provider. Asthma, Acute Bronchospasm Acute bronchospasm caused by asthma is also referred to as an asthma attack. Bronchospasm means your air passages become narrowed. The narrowing is caused by inflammation and tightening of the muscles in the air tubes (bronchi) in your lungs. This can make it hard to breathe or cause you to wheeze and cough. CAUSES Possible triggers are:  Animal dander from the skin, hair, or feathers of animals.  Dust mites contained in house dust.  Cockroaches.  Pollen from trees or grass.  Mold.  Cigarette or tobacco smoke.  Air  pollutants such as dust, household cleaners, hair sprays, aerosol sprays, paint fumes, strong chemicals, or strong odors.  Cold air or weather changes. Cold air may trigger inflammation. Winds increase molds and pollens in the air.  Strong emotions such as crying or laughing hard.  Stress.  Certain medicines such as aspirin or beta-blockers.  Sulfites in foods and drinks, such as dried fruits and wine.  Infections or inflammatory conditions, such as a flu, cold, or inflammation of the nasal membranes (rhinitis).  Gastroesophageal reflux disease (GERD). GERD is a condition where stomach acid backs up into your esophagus.  Exercise or strenuous activity. SIGNS AND SYMPTOMS   Wheezing.  Excessive coughing, particularly at night.  Chest tightness.  Shortness of breath. DIAGNOSIS  Your health care provider will ask you about your medical history and perform a physical exam. A chest X-ray or blood testing may be performed to look for other causes of your symptoms or other conditions that may have triggered your asthma attack. TREATMENT  Treatment is aimed at reducing inflammation and opening up the airways in your lungs. Most asthma attacks are treated with inhaled medicines. These include quick relief or rescue medicines (such as bronchodilators) and controller medicines (such as inhaled corticosteroids). These medicines are sometimes given through an inhaler or a nebulizer. Systemic  steroid medicine taken by mouth or given through an IV tube also can be used to reduce the inflammation when an attack is moderate or severe. Antibiotic medicines are only used if a bacterial infection is present.  HOME CARE INSTRUCTIONS   Rest.  Drink plenty of liquids. This helps the mucus to remain thin and be easily coughed up. Only use caffeine in moderation and do not use alcohol until you have recovered from your illness.  Do not smoke. Avoid being exposed to secondhand smoke.  You play a  critical role in keeping yourself in good health. Avoid exposure to things that cause you to wheeze or to have breathing problems.  Keep your medicines up-to-date and available. Carefully follow your health care provider's treatment plan.  Take your medicine exactly as prescribed.  When pollen or pollution is bad, keep windows closed and use an air conditioner or go to places with air conditioning.  Asthma requires careful medical care. See your health care provider for a follow-up as advised. If you are more than [redacted] weeks pregnant and you were prescribed any new medicines, let your obstetrician know about the visit and how you are doing. Follow up with your health care provider as directed.  After you have recovered from your asthma attack, make an appointment with your outpatient doctor to talk about ways to reduce the likelihood of future attacks. If you do not have a doctor who manages your asthma, make an appointment with a primary care doctor to discuss your asthma. SEEK IMMEDIATE MEDICAL CARE IF:   You are getting worse.  You have trouble breathing. If severe, call your local emergency services (911 in the [place]  You develop chest pain or discomfort.  You are vomiting.  You are not able to keep fluids down.  You are coughing up yellow, green, brown, or bloody sputum.  You have a fever and your symptoms suddenly get worse.  You have trouble swallowing. MAKE SURE YOU:   Understand these instructions.  Will watch your condition.  Will get help right away if you are not doing well or get worse. Document Released: 03/28/2007 Document Revised: 12/16/2013 Document Reviewed: 06/18/2013 [facility] Patient Information 2015 [place], [place]. This information is not intended to replace advice given to you by your health care provider. Make sure you discuss any questions you have with your health care provider.

## 2014-09-14 NOTE — Progress Notes (Addendum)
Subjective:    Patient ID: Dawn Dunn, female    DOB: 1986-06-27, 28 y.o.   MRN: [id number]  Chief Complaint  Patient presents with  . URI    started last Monday, cough started last Wednesday, lost voice today  . Generalized Body Aches    started last Monday    HPI  HPI Comments: Dawn Dunn is a 28 y.o. female with a PMHx of asthma who presents to [facility] complaining of possible URI onset 1 week. Pt reports constant sore throat, nasal congestion, postnasal drip, chills, myalgias, diaphoresis, sinus pressure, SOB, and dry cough. She has tried using a family members nebulizer and inhaler with mild temporary relief. She has also tried OTC Nyquil and 12 hour cough syrup with mild improvement. Dawn Dunn is sleeping okay with Nyquil but is waking every few hours. She denies any chest pain or fever. Pt with known allergy to hydrocodone-acetaminophen. She has no other pertinent past medical history. No other concerns this visit.   Past Medical History  Diagnosis Date  . Kidney stones   . Anemia   . Fibromyalgia   . Mild cervical dysplasia   . Asthma   . Allergy   . Depression     Allergies  Allergen Reactions  . Hydrocodone-Acetaminophen Rash    Only allergic to [employer]; Hydrocodone tablets are ok    Current Outpatient Prescriptions on File Prior to Visit  Medication Sig Dispense Refill  . betamethasone valerate ointment (VALISONE) 0.1 % Apply 1 application topically 2 (two) times daily.  90 g  1  . carisoprodol (SOMA) 350 MG tablet Take 1 tablet (350 mg total) by mouth 4 (four) times daily as needed for muscle spasms.  60 tablet  3  . cholecalciferol (VITAMIN D) 1000 UNITS tablet Take 1,000 Units by mouth daily.      . fluticasone (FLONASE) 50 MCG/ACT nasal spray Place 2 sprays into both nostrils daily.  16 g  6  . ibuprofen (ADVIL,MOTRIN) 800 MG tablet Take 1 tablet (800 mg total) by mouth every 8 (eight) hours as needed.  30 tablet  0  .  ketorolac (TORADOL) 10 MG tablet Take 1 tablet (10 mg total) by mouth every 6 (six) hours as needed.  30 tablet  0  . montelukast (SINGULAIR) 10 MG tablet Take 1 tablet (10 mg total) by mouth at bedtime.  30 tablet  3  . omeprazole (PRILOSEC) 40 MG capsule Take 1 capsule (40 mg total) by mouth daily.  90 capsule  1  . ondansetron (ZOFRAN ODT) 8 MG disintegrating tablet Take 1 tablet (8 mg total) by mouth every 8 (eight) hours as needed for nausea or vomiting.  20 tablet  0  . oxyCODONE-acetaminophen (ROXICET) 5-325 MG per tablet Take 1 tablet by mouth every 4 (four) hours as needed for severe pain.  30 tablet  0  . tamsulosin (FLOMAX) 0.4 MG CAPS capsule Take 1 capsule (0.4 mg total) by mouth daily.  30 capsule  0   No current facility-administered medications on file prior to visit.    Review of Systems  Constitutional: Positive for chills and diaphoresis. Negative for fever.  HENT: Positive for congestion, postnasal drip, rhinorrhea, sinus pressure, sneezing and sore throat.   Respiratory: Positive for cough and shortness of breath.   Cardiovascular: Negative for chest pain.  Musculoskeletal: Positive for myalgias.    Triage Vitals: BP 128/80  Pulse 88  Temp(Src) 97.9 F (36.6 C) (Oral)  Resp 16  Ht 5' 4.75" (1.645 m)  Wt 230 lb 6.4 oz (104.509 kg)  BMI 38.62 kg/m2  SpO2 99%  LMP 09/01/2014   Objective:  Physical Exam  Nursing note and vitals reviewed. Constitutional: She is oriented to person, place, and time. She appears well-developed and well-nourished.  HENT:  Head: Normocephalic.  Right Ear: Tympanic membrane is scarred. A middle ear effusion is present.  Left Ear: Tympanic membrane is scarred and retracted. A middle ear effusion is present.  Nose: Mucosal edema present. No rhinorrhea.  Mouth/Throat: Uvula is midline. Mucous membranes are dry. No trismus in the jaw. No uvula swelling. Posterior oropharyngeal edema present. No oropharyngeal exudate or posterior  oropharyngeal erythema.  Eyes: EOM are normal.  Neck: Normal range of motion.  Cardiovascular: Normal rate, regular rhythm and normal heart sounds.   Pulmonary/Chest: Effort normal. No accessory muscle usage. Not tachypneic. No respiratory distress. She has wheezes. She has rales.  Inspiratory rales and expiratory wheezing throughout Expiratory rhonchi noted  After albuterol neb trx x 2 in office pulm exam sig improved with just rhonchi in RLL  Abdominal: She exhibits no distension.  Musculoskeletal: Normal range of motion.  Lymphadenopathy:       Head (right side): Submandibular and tonsillar adenopathy present.       Head (left side): Submandibular and tonsillar adenopathy present.    She has cervical adenopathy.       Right cervical: No superficial cervical and no posterior cervical adenopathy present.      Left cervical: No superficial cervical and no posterior cervical adenopathy present.  Neurological: She is alert and oriented to person, place, and time.  Psychiatric: She has a normal mood and affect.   Results for orders placed in visit on 09/14/14  POCT CBC      Result Value Ref Range   WBC 12.2 (*) 4.6 - 10.2 K/uL   Lymph, poc 4.1 (*) 0.6 - 3.4   POC LYMPH PERCENT 33.4  10 - 50 %L   MID (cbc) 0.6  0 - 0.9   POC MID % 5.2  0 - 12 %M   POC Granulocyte 7.5 (*) 2 - 6.9   Granulocyte percent 61.4  37 - 80 %G   RBC 4.28  4.04 - 5.48 M/uL   Hemoglobin 12.1 (*) 12.2 - 16.2 g/dL   HCT, POC 39.5  37.7 - 47.9 %   MCV 92.4  80 - 97 fL   MCH, POC 28.2  27 - 31.2 pg   MCHC 30.5 (*) 31.8 - 35.4 g/dL   RDW, POC 17.2     Platelet Count, POC 319  142 - 424 K/uL   MPV 8.4  0 - 99.8 fL   [facility] reading (PRIMARY) by  Dr. Brigitte Pulse. CXR: no acute abnormality or infiltrate seen. Assessment & Plan:   Cough - Plan: DG Chest 2 View, POCT CBC, albuterol (PROVENTIL) (2.5 MG/3ML) 0.083% nebulizer solution 5 mg, methylPREDNISolone acetate (DEPO-MEDROL) injection 120 mg  Asthma with acute  exacerbation, mild intermittent - Plan: albuterol (PROVENTIL) (2.5 MG/3ML) 0.083% nebulizer solution 5 mg, methylPREDNISolone acetate (DEPO-MEDROL) injection 120 mg - borrowed her nephews nebulizer machine so alb nebs rx'd.   Fever, unspecified - Plan: albuterol (PROVENTIL) (2.5 MG/3ML) 0.083% nebulizer solution 5 mg, methylPREDNISolone acetate (DEPO-MEDROL) injection 120 mg  Acute bronchitis, unspecified organism - Plan: methylPREDNISolone acetate (DEPO-MEDROL) injection 120 mg - rec oow until 9/24 with repeat OV in 3d but pt is hoping she can RTW on 9/23.   Common wart -  Rt distal second finger with verrucal lesion s/p cryosurgery in office today.   Meds ordered this encounter  Medications  . chlorpheniramine-HYDROcodone (TUSSIONEX PENNKINETIC ER) 10-8 MG/5ML LQCR    Sig: Take 5 mLs by mouth every 12 (twelve) hours as needed.    Dispense:  120 mL    Refill:  0  . albuterol (PROVENTIL HFA;VENTOLIN HFA) 108 (90 BASE) MCG/ACT inhaler    Sig: Inhale 1-2 puffs into the lungs every 6 (six) hours as needed for wheezing or shortness of breath.    Dispense:  18 g    Refill:  3  . albuterol (PROVENTIL) (2.5 MG/3ML) 0.083% nebulizer solution 5 mg    Sig:   . albuterol (PROVENTIL) (2.5 MG/3ML) 0.083% nebulizer solution    Sig: Take 3 mLs (2.5 mg total) by nebulization every 6 (six) hours as needed for wheezing or shortness of breath.    Dispense:  150 mL    Refill:  1  . azithromycin (ZITHROMAX) 500 MG tablet    Sig: Take 1 tablet (500 mg total) by mouth daily.    Dispense:  3 tablet    Refill:  0  . methylPREDNISolone acetate (DEPO-MEDROL) injection 120 mg    Sig:     I personally performed the services described in this documentation, which was scribed in my presence. The recorded information has been reviewed and considered, and addended by me as needed.  Delman Cheadle, MD MPH

## 2014-09-17 ENCOUNTER — Telehealth: Payer: Self-pay

## 2014-09-17 ENCOUNTER — Ambulatory Visit (INDEPENDENT_AMBULATORY_CARE_PROVIDER_SITE_OTHER): Payer: No Typology Code available for payment source | Admitting: Family Medicine

## 2014-09-17 VITALS — BP 108/78 | HR 91 | Temp 97.7°F | Resp 16 | Ht 64.0 in | Wt 225.8 lb

## 2014-09-17 DIAGNOSIS — J189 Pneumonia, unspecified organism: Secondary | ICD-10-CM

## 2014-09-17 LAB — POCT CBC
GRANULOCYTE PERCENT: 64.9 % (ref 37–80)
HCT, POC: 39.4 % (ref 37.7–47.9)
Hemoglobin: 12.7 g/dL (ref 12.2–16.2)
Lymph, poc: 3.3 (ref 0.6–3.4)
MCH, POC: 28.6 pg (ref 27–31.2)
MCHC: 32.1 g/dL (ref 31.8–35.4)
MCV: 89.1 fL (ref 80–97)
MID (CBC): 0.7 (ref 0–0.9)
MPV: 8.4 fL (ref 0–99.8)
PLATELET COUNT, POC: 338 10*3/uL (ref 142–424)
POC Granulocyte: 7.4 — AB (ref 2–6.9)
POC LYMPH %: 28.8 % (ref 10–50)
POC MID %: 6.3 %M (ref 0–12)
RBC: 4.42 M/uL (ref 4.04–5.48)
RDW, POC: 14.6 %
WBC: 11.4 10*3/uL — AB (ref 4.6–10.2)

## 2014-09-17 MED ORDER — PHENYLEPH-PROMETHAZINE-COD 5-6.25-10 MG/5ML PO SYRP: 5.0000 mL | ORAL_SOLUTION | ORAL | Status: DC | PRN

## 2014-09-17 MED ORDER — CEFDINIR 300 MG PO CAPS: 300.0000 mg | ORAL_CAPSULE | Freq: Two times a day (BID) | ORAL | Status: DC

## 2014-09-17 MED ORDER — PREDNISONE 20 MG PO TABS: ORAL_TABLET | ORAL | Status: DC

## 2014-09-17 NOTE — Patient Instructions (Signed)

## 2014-09-17 NOTE — Telephone Encounter (Signed)
[facility] from [employer] called regarding phenergan with codeine rx sent in today by Dr. Brigitte Pulse. Pharmacy does not have phenergan with codeine in stock. Per Dr. Brigitte Pulse gave VO for pharmacy to give pt Promethazine.

## 2014-09-18 NOTE — Progress Notes (Signed)
Subjective:    Patient ID: Dawn Dunn, female    DOB: September 01, 1986, 28 y.o.   MRN: [id number] Chief Complaint  Patient presents with  . Follow-up    Bronchitis    HPI  Not feeling much better.  Has not been able to go into work this week.  Has been using alb nebulizer machine about twice a day - last tried several hrs ago. Couldn't take the tussionex - made her really itchy even when she tried it w/ benadryl.  Past Medical History  Diagnosis Date  . Kidney stones   . Anemia   . Fibromyalgia   . Mild cervical dysplasia   . Asthma   . Allergy   . Depression    Current Outpatient Prescriptions on File Prior to Visit  Medication Sig Dispense Refill  . albuterol (PROVENTIL HFA;VENTOLIN HFA) 108 (90 BASE) MCG/ACT inhaler Inhale 1-2 puffs into the lungs every 6 (six) hours as needed for wheezing or shortness of breath.  18 g  3  . albuterol (PROVENTIL) (2.5 MG/3ML) 0.083% nebulizer solution Take 3 mLs (2.5 mg total) by nebulization every 6 (six) hours as needed for wheezing or shortness of breath.  150 mL  1  . betamethasone valerate ointment (VALISONE) 0.1 % Apply 1 application topically 2 (two) times daily.  90 g  1  . carisoprodol (SOMA) 350 MG tablet Take 1 tablet (350 mg total) by mouth 4 (four) times daily as needed for muscle spasms.  60 tablet  3  . cholecalciferol (VITAMIN D) 1000 UNITS tablet Take 1,000 Units by mouth daily.      . fluticasone (FLONASE) 50 MCG/ACT nasal spray Place 2 sprays into both nostrils daily.  16 g  6  . ibuprofen (ADVIL,MOTRIN) 800 MG tablet Take 1 tablet (800 mg total) by mouth every 8 (eight) hours as needed.  30 tablet  0  . ketorolac (TORADOL) 10 MG tablet Take 1 tablet (10 mg total) by mouth every 6 (six) hours as needed.  30 tablet  0  . montelukast (SINGULAIR) 10 MG tablet Take 1 tablet (10 mg total) by mouth at bedtime.  30 tablet  3  . omeprazole (PRILOSEC) 40 MG capsule Take 1 capsule (40 mg total) by mouth daily.  90 capsule  1  .  ondansetron (ZOFRAN ODT) 8 MG disintegrating tablet Take 1 tablet (8 mg total) by mouth every 8 (eight) hours as needed for nausea or vomiting.  20 tablet  0  . oxyCODONE-acetaminophen (ROXICET) 5-325 MG per tablet Take 1 tablet by mouth every 4 (four) hours as needed for severe pain.  30 tablet  0  . tamsulosin (FLOMAX) 0.4 MG CAPS capsule Take 1 capsule (0.4 mg total) by mouth daily.  30 capsule  0   No current facility-administered medications on file prior to visit.   Allergies  Allergen Reactions  . Hydrocodone-Acetaminophen Rash    Only allergic to [employer]; Hydrocodone tablets are ok  . Tussionex Pennkinetic Er [Hydrocod Polst-Cpm Polst Er] Itching     Review of Systems  Constitutional: Positive for diaphoresis, activity change, appetite change and fatigue. Negative for fever and chills.  HENT: Positive for congestion, postnasal drip, rhinorrhea, sinus pressure, sore throat, trouble swallowing and voice change. Negative for sneezing.   Respiratory: Positive for cough, chest tightness, shortness of breath and wheezing. Negative for stridor.   Cardiovascular: Negative for chest pain and palpitations.  Gastrointestinal: Negative for nausea and vomiting.  Musculoskeletal: Negative for arthralgias.  Skin: Positive for  rash.  Hematological: Negative for adenopathy.  Psychiatric/Behavioral: Positive for sleep disturbance.       Objective:  BP 108/78  Pulse 91  Temp(Src) 97.7 F (36.5 C) (Oral)  Resp 16  Ht 5\' 4"  (1.626 m)  Wt 225 lb 12.8 oz (102.422 kg)  BMI 38.74 kg/m2  SpO2 95%  LMP 09/01/2014  Physical Exam  Constitutional: She is oriented to person, place, and time. She appears well-developed and well-nourished. She appears lethargic. She appears ill. No distress.  HENT:  Head: Normocephalic and atraumatic.  Right Ear: External ear and ear canal normal. Tympanic membrane is retracted. A middle ear effusion is present.  Left Ear: External ear and ear canal normal.  Tympanic membrane is retracted. A middle ear effusion is present.  Nose: Mucosal edema and rhinorrhea present. Right sinus exhibits maxillary sinus tenderness. Left sinus exhibits maxillary sinus tenderness.  Mouth/Throat: Uvula is midline and mucous membranes are normal. Posterior oropharyngeal erythema present. No oropharyngeal exudate, posterior oropharyngeal edema or tonsillar abscesses.  Eyes: Conjunctivae are normal. Right eye exhibits no discharge. Left eye exhibits no discharge. No scleral icterus.  Neck: Normal range of motion. Neck supple.  Cardiovascular: Normal rate, regular rhythm, normal heart sounds and intact distal pulses.   Pulmonary/Chest: Effort normal. She has wheezes. She has rhonchi in the right lower field and the left lower field.  pulm exam improved from 3d prior  Lymphadenopathy:       Head (right side): Submandibular adenopathy present. No preauricular and no posterior auricular adenopathy present.       Head (left side): Submandibular adenopathy present. No preauricular and no posterior auricular adenopathy present.    She has no cervical adenopathy.       Right: No supraclavicular adenopathy present.       Left: No supraclavicular adenopathy present.  Neurological: She is oriented to person, place, and time. She appears lethargic.  Skin: Skin is warm and dry. She is not diaphoretic. No erythema.  Psychiatric: She has a normal mood and affect. Her behavior is normal.      Results for orders placed in visit on 09/17/14  POCT CBC      Result Value Ref Range   WBC 11.4 (*) 4.6 - 10.2 K/uL   Lymph, poc 3.3  0.6 - 3.4   POC LYMPH PERCENT 28.8  10 - 50 %L   MID (cbc) 0.7  0 - 0.9   POC MID % 6.3  0 - 12 %M   POC Granulocyte 7.4 (*) 2 - 6.9   Granulocyte percent 64.9  37 - 80 %G   RBC 4.42  4.04 - 5.48 M/uL   Hemoglobin 12.7  12.2 - 16.2 g/dL   HCT, POC 39.4  37.7 - 47.9 %   MCV 89.1  80 - 97 fL   MCH, POC 28.6  27 - 31.2 pg   MCHC 32.1  31.8 - 35.4 g/dL    RDW, POC 14.6     Platelet Count, POC 338  142 - 424 K/uL   MPV 8.4  0 - 99.8 fL       Assessment & Plan:   Pneumonia, organism unspecified - Plan: POCT CBC Recheck in 3d if not significantly improving. Cont albuterol neb q4 hrs prn.  Try codeine cough syrup altneratively. Meds ordered this encounter  Medications  . predniSONE (DELTASONE) 20 MG tablet    Sig: Take 3 tabs po qd x 2d, then 2 tabs po qd x 2d, then 1 tab po  qd x 2d, then 1/2 tab po qd x 2d, then stop    Dispense:  13 tablet    Refill:  0  . cefdinir (OMNICEF) 300 MG capsule    Sig: Take 1 capsule (300 mg total) by mouth 2 (two) times daily.    Dispense:  20 capsule    Refill:  0  . Phenyleph-Promethazine-Cod 5-6.25-10 MG/5ML SYRP    Sig: Take 5-10 mLs by mouth every 4 (four) hours as needed (cough, congestion).    Dispense:  180 mL    Refill:  0    Delman Cheadle, MD MPH

## 2016-05-11 ENCOUNTER — Encounter: Payer: Self-pay | Admitting: Family Medicine

## 2016-05-11 ENCOUNTER — Ambulatory Visit (INDEPENDENT_AMBULATORY_CARE_PROVIDER_SITE_OTHER): Payer: BLUE CROSS/BLUE SHIELD | Admitting: Family Medicine

## 2016-05-11 VITALS — BP 109/76 | HR 101 | Temp 98.4°F | Resp 16 | Ht 64.0 in | Wt 215.0 lb

## 2016-05-11 DIAGNOSIS — D235 Other benign neoplasm of skin of trunk: Secondary | ICD-10-CM | POA: Diagnosis not present

## 2016-05-11 DIAGNOSIS — Z1389 Encounter for screening for other disorder: Secondary | ICD-10-CM

## 2016-05-11 DIAGNOSIS — N949 Unspecified condition associated with female genital organs and menstrual cycle: Secondary | ICD-10-CM | POA: Diagnosis not present

## 2016-05-11 DIAGNOSIS — R109 Unspecified abdominal pain: Secondary | ICD-10-CM | POA: Diagnosis not present

## 2016-05-11 DIAGNOSIS — D225 Melanocytic nevi of trunk: Secondary | ICD-10-CM

## 2016-05-11 DIAGNOSIS — B351 Tinea unguium: Secondary | ICD-10-CM

## 2016-05-11 DIAGNOSIS — Z130 Encounter for screening for diseases of the blood and blood-forming organs and certain disorders involving the immune mechanism: Secondary | ICD-10-CM | POA: Diagnosis not present

## 2016-05-11 DIAGNOSIS — M25571 Pain in right ankle and joints of right foot: Secondary | ICD-10-CM | POA: Diagnosis not present

## 2016-05-11 DIAGNOSIS — Z136 Encounter for screening for cardiovascular disorders: Secondary | ICD-10-CM | POA: Diagnosis not present

## 2016-05-11 DIAGNOSIS — Z0000 Encounter for general adult medical examination without abnormal findings: Secondary | ICD-10-CM | POA: Diagnosis not present

## 2016-05-11 DIAGNOSIS — Z1329 Encounter for screening for other suspected endocrine disorder: Secondary | ICD-10-CM | POA: Diagnosis not present

## 2016-05-11 DIAGNOSIS — M797 Fibromyalgia: Secondary | ICD-10-CM

## 2016-05-11 DIAGNOSIS — Z124 Encounter for screening for malignant neoplasm of cervix: Secondary | ICD-10-CM

## 2016-05-11 DIAGNOSIS — F410 Panic disorder [episodic paroxysmal anxiety] without agoraphobia: Secondary | ICD-10-CM

## 2016-05-11 DIAGNOSIS — N926 Irregular menstruation, unspecified: Secondary | ICD-10-CM | POA: Diagnosis not present

## 2016-05-11 DIAGNOSIS — E559 Vitamin D deficiency, unspecified: Secondary | ICD-10-CM | POA: Diagnosis not present

## 2016-05-11 DIAGNOSIS — Z1383 Encounter for screening for respiratory disorder NEC: Secondary | ICD-10-CM

## 2016-05-11 DIAGNOSIS — Z113 Encounter for screening for infections with a predominantly sexual mode of transmission: Secondary | ICD-10-CM

## 2016-05-11 LAB — LIPID PANEL
Cholesterol: 207 mg/dL — ABNORMAL HIGH (ref 125–200)
HDL: 43 mg/dL — ABNORMAL LOW (ref >=46)
LDL CALC: 138 mg/dL — AB (ref <130)
Total CHOL/HDL Ratio: 4.8 Ratio (ref <=5.0)
Triglycerides: 132 mg/dL (ref <150)
VLDL: 26 mg/dL (ref <30)

## 2016-05-11 LAB — POC MICROSCOPIC URINALYSIS (UMFC): Mucus: ABSENT

## 2016-05-11 LAB — POCT URINALYSIS DIP (MANUAL ENTRY)
Bilirubin, UA: NEGATIVE
Glucose, UA: NEGATIVE
Ketones, POC UA: NEGATIVE
Leukocytes, UA: NEGATIVE
NITRITE UA: NEGATIVE
PH UA: 7.5
Spec Grav, UA: 1.02
UROBILINOGEN UA: 0.2

## 2016-05-11 LAB — CBC
HEMATOCRIT: 37.8 % (ref 35.0–45.0)
HEMOGLOBIN: 12.6 g/dL (ref 11.7–15.5)
MCH: 29.4 pg (ref 27.0–33.0)
MCHC: 33.3 g/dL (ref 32.0–36.0)
MCV: 88.3 fL (ref 80.0–100.0)
MPV: 10.6 fL (ref 7.5–12.5)
Platelets: 335 10*3/uL (ref 140–400)
RBC: 4.28 MIL/uL (ref 3.80–5.10)
RDW: 13.7 % (ref 11.0–15.0)
WBC: 9.4 10*3/uL (ref 3.8–10.8)

## 2016-05-11 LAB — POCT WET + KOH PREP
Trich by wet prep: ABSENT
YEAST BY KOH: ABSENT
YEAST BY WET PREP: ABSENT

## 2016-05-11 LAB — COMPREHENSIVE METABOLIC PANEL
ALK PHOS: 113 U/L (ref 33–115)
ALT: 22 U/L (ref 6–29)
AST: 21 U/L (ref 10–30)
Albumin: 4.4 g/dL (ref 3.6–5.1)
BUN: 12 mg/dL (ref 7–25)
CO2: 23 mmol/L (ref 20–31)
CREATININE: 0.56 mg/dL (ref 0.50–1.10)
Calcium: 8.9 mg/dL (ref 8.6–10.2)
Chloride: 103 mmol/L (ref 98–110)
GLUCOSE: 104 mg/dL — AB (ref 65–99)
Potassium: 4.3 mmol/L (ref 3.5–5.3)
SODIUM: 135 mmol/L (ref 135–146)
TOTAL PROTEIN: 7 g/dL (ref 6.1–8.1)
Total Bilirubin: 0.4 mg/dL (ref 0.2–1.2)

## 2016-05-11 LAB — TSH: TSH: 3.33 mIU/L

## 2016-05-11 MED ORDER — CARISOPRODOL 350 MG PO TABS: 350.0000 mg | ORAL_TABLET | Freq: Four times a day (QID) | ORAL | Status: AC | PRN

## 2016-05-11 MED ORDER — TERBINAFINE HCL 250 MG PO TABS: 250.0000 mg | ORAL_TABLET | Freq: Every day | ORAL | Status: AC

## 2016-05-11 MED ORDER — DICYCLOMINE HCL 10 MG PO CAPS: 10.0000 mg | ORAL_CAPSULE | Freq: Three times a day (TID) | ORAL | Status: AC

## 2016-05-11 NOTE — Progress Notes (Signed)
Subjective:    Patient ID: Dawn Dunn, female    DOB: 12-06-1986, 30 y.o.   MRN: [id number] Chief Complaint  Patient presents with  . Annual Exam  . Gynecologic Exam    HPI  Last CPE was Jan 22015  Lipids nml in 014 TDaP 2012 Gardasil 2008, hep B 1996  Pqp 11/2012  Smoking?  nNot sleeping as well since working second shift. Is sexually active Broke lft foot last yr She has been having more problems with hger right ankle which she has sprained prior. She has been getinng out an walking her dogs but her knees are hurting hyer. - no pool or elliptical access - working [employer].  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  Has been taking otc ibuprofen prn.  She has managed to make last Soma until last mo as she only takes it when she is severe - gets occ mu scle pulls from resetting shevles. Had flu last mo - followed by flu and conjuectivitys. No vitamins/supps. Having more stomach cramping with diarrhea - changed with salad - cooks with lactose free milk but still cheese and ice cream. No probiotics.  Has been more anxious lately which causes more stomach cramps. Lactose intolerance seems to be worsening.  Is hiaving more at work.    Panic attack at a dentist  Toe nails are still bad - large toenails on each side.  Has had to pull off left toenail prior. Had to file down.  Was on lamisil x 6 mos.  Abscess removed from vulva - 6 mo ago Condoms every time  Past Medical History  Diagnosis Date  . Kidney stones   . Anemia   . Fibromyalgia   . Mild cervical dysplasia   . Asthma   . Allergy   . Depression    Past Surgical History  Procedure Laterality Date  . Right ankle fracture    . Wisdom tooth extraction    . Cystoscopy    . Cervical cyrosurgery     Current Outpatient Prescriptions on File Prior to Visit  Medication Sig Dispense Refill  . albuterol (PROVENTIL HFA;VENTOLIN HFA) 108 (90 BASE) MCG/ACT inhaler Inhale 1-2 puffs into the lungs every 6 (six) hours as  needed for wheezing or shortness of breath. 18 g 3  . albuterol (PROVENTIL) (2.5 MG/3ML) 0.083% nebulizer solution Take 3 mLs (2.5 mg total) by nebulization every 6 (six) hours as needed for wheezing or shortness of breath. 150 mL 1  . cholecalciferol (VITAMIN D) 1000 UNITS tablet Take 1,000 Units by mouth daily.    . fluticasone (FLONASE) 50 MCG/ACT nasal spray Place 2 sprays into both nostrils daily. 16 g 6  . omeprazole (PRILOSEC) 40 MG capsule Take 1 capsule (40 mg total) by mouth daily. 90 capsule 1   No current facility-administered medications on file prior to visit.   Allergies  Allergen Reactions  . Hydrocodone-Acetaminophen Rash    Only allergic to [employer]; Hydrocodone tablets are ok  . Tussionex Pennkinetic Er [Hydrocod Polst-Cpm Polst Er] Itching   Family History  Problem Relation Age of Onset  . Diabetes Father   . Depression Father   . Hyperlipidemia Father    Social History   Social History  . Marital Status: Single    Spouse Name: N/A  . Number of Children: N/A  . Years of Education: N/A   Social History Main Topics  . Smoking status: Current Every Day Smoker -- 1.00 packs/day for 8 years    Last Attempt  to Quit: 11/25/2012  . Smokeless tobacco: Never Used  . Alcohol Use: Yes     Comment: rarely  . Drug Use: No  . Sexual Activity: No   Other Topics Concern  . None   Social History Narrative   Patient is an Water quality scientist. She exercises walking 1-2 times weekly for 30 minutes.    Review of Systems  Musculoskeletal: Positive for myalgias, back pain and arthralgias.  Allergic/Immunologic: Positive for environmental allergies.  Psychiatric/Behavioral: Positive for sleep disturbance. The patient is nervous/anxious.   All other systems reviewed and are negative.      Objective:  BP 109/76 mmHg  Pulse 101  Temp(Src) 98.4 F (36.9 C)  Resp 16  Ht 5\' 4"  (1.626 m)  Wt 215 lb (97.523 kg)  BMI 36.89 kg/m2  LMP 04/24/2016  Physical Exam   Constitutional: She is oriented to person, place, and time. She appears well-developed and well-nourished. No distress.  HENT:  Head: Normocephalic and atraumatic.  Right Ear: Tympanic membrane, external ear and ear canal normal.  Left Ear: Tympanic membrane, external ear and ear canal normal.  Nose: Nose normal. No mucosal edema or rhinorrhea.  Mouth/Throat: Uvula is midline, oropharynx is clear and moist and mucous membranes are normal. No posterior oropharyngeal erythema.  Eyes: Conjunctivae and EOM are normal. Pupils are equal, round, and reactive to light. Right eye exhibits no discharge. Left eye exhibits no discharge. No scleral icterus.  Neck: Normal range of motion. Neck supple. No thyromegaly present.  Cardiovascular: Normal rate, regular rhythm, normal heart sounds and intact distal pulses.   Pulmonary/Chest: Effort normal and breath sounds normal. No respiratory distress.  Abdominal: Soft. Bowel sounds are normal. There is no tenderness.  Genitourinary: Vagina normal. No breast swelling, tenderness, discharge or bleeding. Uterus is tender. Cervix exhibits no motion tenderness and no friability. Right adnexum displays no mass and no tenderness. Left adnexum displays no mass and no tenderness.  Musculoskeletal: She exhibits no edema.  Lymphadenopathy:    She has no cervical adenopathy.  Neurological: She is alert and oriented to person, place, and time. She has normal reflexes.  Skin: Skin is warm and dry. She is not diaphoretic. No erythema.  Psychiatric: She has a normal mood and affect. Her behavior is normal.          Assessment & Plan:   1. Annual physical exam   2. Pain in joint, ankle and foot, right   3. Onychomycosis - restart lamisil - is starting to see some improvement  4. Atypical nevus of pubic region - RTC for removal  5. Routine screening for STI (sexually transmitted infection)   6. Screening for cardiovascular, respiratory, and genitourinary diseases     7. Screening for cervical cancer - Pt prefers annual pap so no need for HPV testing  8. Screening for deficiency anemia   9. Screening for thyroid disorder   10. Uterine tenderness   11. Menstrual changes   12. Vitamin D deficiency   13. Fibromyalgia   14. Panic attack   15. Abdominal cramping - try prn bentyl, pt has many ibs sxs - diarrhea predominant. Try probiotics with yogurt, try peppermint supp    Orders Placed This Encounter  Procedures  . CBC  . Lipid panel    Order Specific Question:  Has the patient fasted?    Answer:  Yes  . Comprehensive metabolic panel    Order Specific Question:  Has the patient fasted?    Answer:  Yes  .  TSH  . VITAMIN D 25 Hydroxy (Vit-D Deficiency, Fractures)  . RPR  . HIV antibody  . Hepatitis C Antibody  . POCT urinalysis dipstick  . POCT Microscopic Urinalysis ([facility])  . POCT Wet + KOH Prep    Meds ordered this encounter  Medications  . carisoprodol (SOMA) 350 MG tablet    Sig: Take 1 tablet (350 mg total) by mouth 4 (four) times daily as needed for muscle spasms.    Dispense:  60 tablet    Refill:  3  . terbinafine (LAMISIL) 250 MG tablet    Sig: Take 1 tablet (250 mg total) by mouth daily.    Dispense:  90 tablet    Refill:  3  . dicyclomine (BENTYL) 10 MG capsule    Sig: Take 1 capsule (10 mg total) by mouth 4 (four) times daily -  before meals and at bedtime. As needed for abdominal cramping with diarrhea    Dispense:  60 capsule    Refill:  0     Delman Cheadle, MD MPH

## 2016-05-11 NOTE — Patient Instructions (Addendum)
We recommend that you schedule a mammogram for breast cancer screening. Typically, you do not need a referral to do this. Please contact a local imaging center to schedule your mammogram.  [facility] - [phone]  *ask for the Radiology Department [facility] - [phone] or [phone]  [facility] - [phone] [place] [phone] [facility] - [phone]  *ask for the [facility] - [phone]  *ask for the Radiology Department [facility] - [phone]  *ask for the [facility] - [phone]  IF you received an x-ray today, you will receive an invoice from [facility] Radiology. Please contact [facility] at [phone] with questions or concerns regarding your invoice.   IF you received labwork today, you will receive an invoice from [employer]. Please contact [facility] at [phone] with questions or concerns regarding your invoice.   Our billing staff will not be able to assist you with questions regarding bills from these companies.  You will be contacted with the lab results as soon as they are available. The fastest way to get your results is to activate your My Chart account. Instructions are located on the last page of this paperwork. If you have not heard from [place] regarding the results in 2 weeks, please contact this office.   Keeping You Healthy  Get These Tests 1. Blood Pressure- Have your blood pressure checked once a year by your health care provider.  Normal blood pressure is 120/80. 2. Weight- Have your body mass index (BMI) calculated to screen for obesity.  BMI is measure of body fat based on height and weight.  You can also calculate your own BMI at GravelBags.it. 3. Cholesterol- Have your cholesterol checked every 5 years starting at age 68  then yearly starting at age 2. 66. Chlamydia, HIV, and other sexually transmitted diseases- Get screened every year until age 36, then within three months of each new sexual provider. 5. Pap Test - Every 1-5 years; discuss with your health care provider. 6. Mammogram- Every 1-2 years starting at age 24--50  Take these medicines  Calcium with Vitamin D-Your body needs 1200 mg of Calcium each day and [phone] IU of Vitamin D daily.  Your body can only absorb 500 mg of Calcium at a time so Calcium must be taken in 2 or 3 divided doses throughout the day.  Multivitamin with folic acid- Once daily if it is possible for you to become pregnant.  Get these Immunizations  Gardasil-Series of three doses; prevents HPV related illness such as genital warts and cervical cancer.  Menactra-Single dose; prevents meningitis.  Tetanus shot- Every 10 years.  Flu shot-Every year.  Take these steps 1. Do not smoke-Your healthcare provider can help you quit.  For tips on how to quit go to www.smokefree.gov or call 1-800 QUITNOW. 2. Be physically active- Exercise 5 days a week for at least 30 minutes.  If you are not already physically active, start slow and gradually work up to 30 minutes of moderate physical activity.  Examples of moderate activity include walking briskly, dancing, swimming, bicycling, etc. 3. Breast Cancer- A self breast exam every month is important for early detection of breast cancer.  For more information and instruction on self breast exams, ask your healthcare provider or https://www.patel.info/. 4. Eat a healthy diet- Eat a variety of healthy foods such as fruits, vegetables, whole  grains, low fat milk, low fat cheeses, yogurt, lean meats, poultry and fish, beans, nuts, tofu, etc.  For more information go to www. Thenutritionsource.org 5. Drink alcohol in moderation- Limit alcohol intake to one drink or less per day. Never drink and drive. 6. Depression- Your  emotional health is as important as your physical health.  If you're feeling down or losing interest in things you normally enjoy please talk to your healthcare provider about being screened for depression. 7. Dental visit- Brush and floss your teeth twice daily; visit your dentist twice a year. 8. Eye doctor- Get an eye exam at least every 2 years. 9. Helmet use- Always wear a helmet when riding a bicycle, motorcycle, rollerblading or skateboarding. 36. Safe sex- If you may be exposed to sexually transmitted infections, use a condom. 11. Seat belts- Seat belts can save your live; always wear one. 12. Smoke/Carbon Monoxide detectors- These detectors need to be installed on the appropriate level of your home. Replace batteries at least once a year. 13. Skin cancer- When out in the sun please cover up and use sunscreen 15 SPF or higher. 14. Violence- If anyone is threatening or hurting you, please tell your healthcare provider.         Irritable Bowel Syndrome, Adult Irritable bowel syndrome (IBS) is not one specific disease. It is a group of symptoms that affects the organs responsible for digestion (gastrointestinal or GI tract).  To regulate how your GI tract works, your body sends signals back and forth between your intestines and your brain. If you have IBS, there may be a problem with these signals. As a result, your GI tract does not function normally. Your intestines may become more sensitive and overreact to certain things. This is especially true when you eat certain foods or when you are under stress.  There are four types of IBS. These may be determined based on the consistency of your stool:   IBS with diarrhea.   IBS with constipation.   Mixed IBS.   Unsubtyped IBS.  It is important to know which type of IBS you have. Some treatments are more likely to be helpful for certain types of IBS.  CAUSES  The exact cause of IBS is not known. RISK FACTORS You may have a higher  risk of IBS if: 7. You are a woman. 42. You are younger than 32 years old. 9. You have a family history of IBS. 10. You have mental health problems. 11. You have had bacterial infection of your GI tract. SIGNS AND SYMPTOMS  Symptoms of IBS vary from person to person. The main symptom is abdominal pain or discomfort. Additional symptoms usually include one or more of the following:  3. Diarrhea, constipation, or both.  4. Abdominal swelling or bloating.  5. Feeling full or sick after eating a small or regular-size meal.  6. Frequent gas.  7. Mucus in the stool.  8. A feeling of having more stool left after a bowel movement.  Symptoms tend to come and go. They may be associated with stress, psychiatric conditions, or nothing at all.  DIAGNOSIS  There is no specific test to diagnose IBS. Your health care provider will make a diagnosis based on a physical exam, medical history, and your symptoms. You may have other tests to rule out other conditions that may be causing your symptoms. These may include:  5. Blood tests.  6. X-rays.  7. CT scan. 8. Endoscopy and colonoscopy. This is a test in  which your GI tract is viewed with a long, thin, flexible tube. TREATMENT There is no cure for IBS, but treatment can help relieve symptoms. IBS treatment often includes:   Changes to your diet, such as:  Eating more fiber.  Avoiding foods that cause symptoms.  Drinking more water.  Eating regular, medium-sized portioned meals.  Medicines. These may include:  Fiber supplements if you have constipation.  Medicine to control diarrhea (antidiarrheal medicines).  Medicine to help control muscle spasms in your GI tract (antispasmodic medicines).  Medicines to help with any mental health issues, such as antidepressants or tranquilizers.  Therapy.  Talk therapy may help with anxiety, depression, or other mental health issues that can make IBS symptoms worse.  Stress  reduction.  Managing your stress can help keep symptoms under control. HOME CARE INSTRUCTIONS  15. Take medicines only as directed by your health care provider. 16. Eat a healthy diet. 1. Avoid foods and drinks with added sugar. 2. Include more whole grains, fruits, and vegetables gradually into your diet. This may be especially helpful if you have IBS with constipation. 3. Avoid any foods and drinks that make your symptoms worse. These may include dairy products and caffeinated or carbonated drinks. 4. Do not eat large meals. 5. Drink enough fluid to keep your urine clear or pale yellow. 17. Exercise regularly. Ask your health care provider for recommendations of good activities for you. 77. Keep all follow-up visits as directed by your health care provider. This is important. SEEK MEDICAL CARE IF:   You have constant pain.  You have trouble or pain with swallowing.  You have worsening diarrhea. SEEK IMMEDIATE MEDICAL CARE IF:   You have severe and worsening abdominal pain.   You have diarrhea and:   You have a rash, stiff neck, or severe headache.   You are irritable, sleepy, or difficult to awaken.   You are weak, dizzy, or extremely thirsty.   You have bright red blood in your stool or you have black tarry stools.   You have unusual abdominal swelling that is painful.   You vomit continuously.   You vomit blood (hematemesis).   You have both abdominal pain and a fever.    This information is not intended to replace advice given to you by your health care provider. Make sure you discuss any questions you have with your health care provider.   Document Released: 12/11/2005 Document Revised: 01/01/2015 Document Reviewed: 08/28/2014 Elsevier Interactive Patient Education 2016 [place]. Diet for Irritable Bowel Syndrome When you have irritable bowel syndrome (IBS), the foods you eat and your eating habits are very important. IBS may cause various  symptoms, such as abdominal pain, constipation, or diarrhea. Choosing the right foods can help ease discomfort caused by these symptoms. Work with your health care provider and dietitian to find the best eating plan to help control your symptoms. WHAT GENERAL GUIDELINES DO I NEED TO FOLLOW?  Keep a food diary. This will help you identify foods that cause symptoms. Write down:  What you eat and when.  What symptoms you have.  When symptoms occur in relation to your meals.  Avoid foods that cause symptoms. Talk with your dietitian about other ways to get the same nutrients that are in these foods.  Eat more foods that contain fiber. Take a fiber supplement if directed by your dietitian.  Eat your meals slowly, in a relaxed setting.  Aim to eat 5-6 small meals per day. Do not skip meals.  Drink enough fluids to keep your urine clear or pale yellow.  Ask your health care provider if you should take an over-the-counter probiotic during flare-ups to help restore healthy gut bacteria.  If you have cramping or diarrhea, try making your meals low in fat and high in carbohydrates. Examples of carbohydrates are pasta, rice, whole grain breads and cereals, fruits, and vegetables.  If dairy products cause your symptoms to flare up, try eating less of them. You might be able to handle yogurt better than other dairy products because it contains bacteria that help with digestion. WHAT FOODS ARE NOT RECOMMENDED? The following are some foods and drinks that may worsen your symptoms: 12. Fatty foods, such as [place] fries. 13. Milk products, such as cheese or ice cream. 14. Chocolate. 15. Alcohol. 16. Products with caffeine, such as coffee. 17. Carbonated drinks, such as soda. The items listed above may not be a complete list of foods and beverages to avoid. Contact your dietitian for more information. WHAT FOODS ARE GOOD SOURCES OF FIBER? Your health care provider or dietitian may recommend that you  eat more foods that contain fiber. Fiber can help reduce constipation and other IBS symptoms. Add foods with fiber to your diet a little at a time so that your body can get used to them. Too much fiber at once might cause gas and swelling of your abdomen. The following are some foods that are good sources of fiber: 9. Apples. 10. Peaches. 11. Pears. 12. Berries. 13. Figs. 14. Broccoli (raw). 15. Cabbage. 16. Carrots. 17. Raw peas. 18. Kidney beans. [place] beans. 20. Whole grain bread. 21. Whole grain cereal. FOR MORE INFORMATION  International Foundation for Functional Gastrointestinal Disorders: www.iffgd.[employer] of Diabetes and Digestive and Kidney Diseases: NetworkAffair.co.za.aspx   This information is not intended to replace advice given to you by your health care provider. Make sure you discuss any questions you have with your health care provider.   Document Released: 03/02/2004 Document Revised: 01/01/2015 Document Reviewed: 03/13/2014 Elsevier Interactive Patient Education [employer].

## 2016-05-12 LAB — RPR

## 2016-05-12 LAB — HIV ANTIBODY (ROUTINE TESTING W REFLEX): HIV 1&2 Ab, 4th Generation: NONREACTIVE

## 2016-05-12 LAB — HEPATITIS C ANTIBODY: HCV AB: NEGATIVE

## 2016-05-12 LAB — VITAMIN D 25 HYDROXY (VIT D DEFICIENCY, FRACTURES): Vit D, 25-Hydroxy: 19 ng/mL — ABNORMAL LOW (ref 30–100)

## 2016-05-13 LAB — PAP IG, CT-NG, RFX HPV ASCU
Chlamydia Probe Amp: NOT DETECTED
GC Probe Amp: NOT DETECTED

## 2016-05-18 ENCOUNTER — Encounter: Payer: Self-pay | Admitting: Family Medicine

## 2016-05-20 NOTE — Telephone Encounter (Signed)
Dr. Brigitte Pulse,  Was patient supposed to have that in her AVS? Your note just says to have breast exam done at follow up.

## 2016-05-22 ENCOUNTER — Encounter: Payer: Self-pay | Admitting: Family Medicine

## 2016-06-01 ENCOUNTER — Ambulatory Visit
Admission: RE | Admit: 2016-06-01 | Discharge: 2016-06-01 | Disposition: A | Discharge status: Home / Self Care | Payer: BLUE CROSS/BLUE SHIELD | Source: Ambulatory Visit | Attending: Family Medicine | Admitting: Family Medicine

## 2016-06-01 DIAGNOSIS — N926 Irregular menstruation, unspecified: Secondary | ICD-10-CM

## 2016-06-01 DIAGNOSIS — N949 Unspecified condition associated with female genital organs and menstrual cycle: Secondary | ICD-10-CM

## 2016-06-01 DIAGNOSIS — R109 Unspecified abdominal pain: Secondary | ICD-10-CM

## 2016-06-08 ENCOUNTER — Telehealth: Payer: Self-pay

## 2016-06-08 NOTE — Telephone Encounter (Signed)
The patient states that Dr Brigitte Pulse was supposed to send in a prescription for Vitamin D.  She uses the [facility] in [place].  Please advise, thank you.  CB# before 4pm: [phone] CB#: after 4pm: [phone]

## 2016-06-08 NOTE — Telephone Encounter (Signed)
Dr. Brigitte Pulse, What dose of Vitamin D did you want to send in? 1,000 units daily?

## 2016-06-09 MED ORDER — VITAMIN D (ERGOCALCIFEROL) 1.25 MG (50000 UNIT) PO CAPS: 50000.0000 [IU] | ORAL_CAPSULE | ORAL | Status: AC

## 2016-06-09 NOTE — Telephone Encounter (Signed)
Sent in

## 2016-06-29 ENCOUNTER — Ambulatory Visit (INDEPENDENT_AMBULATORY_CARE_PROVIDER_SITE_OTHER): Payer: BLUE CROSS/BLUE SHIELD | Admitting: Family Medicine

## 2016-06-29 ENCOUNTER — Encounter: Payer: Self-pay | Admitting: Family Medicine

## 2016-06-29 VITALS — BP 115/76 | HR 96 | Temp 98.3°F | Resp 16 | Ht 64.0 in | Wt 216.0 lb

## 2016-06-29 DIAGNOSIS — M25561 Pain in right knee: Secondary | ICD-10-CM

## 2016-06-29 DIAGNOSIS — M7041 Prepatellar bursitis, right knee: Secondary | ICD-10-CM

## 2016-06-29 DIAGNOSIS — M222X1 Patellofemoral disorders, right knee: Secondary | ICD-10-CM | POA: Diagnosis not present

## 2016-06-29 DIAGNOSIS — D229 Melanocytic nevi, unspecified: Secondary | ICD-10-CM

## 2016-06-29 NOTE — Patient Instructions (Signed)
Sutured Wound Care Sutures are stitches that can be used to close wounds. Taking care of your wound properly can help to prevent pain and infection. It can also help your wound to heal more quickly. HOW TO CARE FOR YOUR SUTURED WOUND Wound Care  Keep the wound clean and dry.  If you were given a bandage (dressing), you should change it at least once per day or as directed by your health care provider. You should also change it if it becomes wet or dirty.  Keep the wound completely dry for the first 24 hours or as directed by your health care provider. After that time, you may shower or bathe. However, make sure that the wound is not soaked in water until the sutures have been removed.  Clean the wound one time each day or as directed by your health care provider.  Wash the wound with soap and water.  Rinse the wound with water to remove all soap.  Pat the wound dry with a clean towel. Do not rub the wound.  Aftercleaning the wound, apply a thin layer of antibioticointment as directed by your health care provider. This will help to prevent infection and keep the dressing from sticking to the wound.  Have the sutures removed as directed by your health care provider. General Instructions  Take or apply medicines only as directed by your health care provider.  To help prevent scarring, make sure to cover your wound with sunscreen whenever you are outside after the sutures are removed and the wound is healed. Make sure to wear a sunscreen of at least 30 SPF.  If you were prescribed an antibiotic medicine or ointment, finish all of it even if you start to feel better.  Do not scratch or pick at the wound.  Keep all follow-up visits as directed by your health care provider. This is important.  Check your wound every day for signs of infection. Watch for:   Redness, swelling, or pain.  Fluid, blood, or pus.  Raise (elevate) the injured area above the level of your heart while you  are sitting or lying down, if possible.  Avoid stretching your wound.  Drink enough fluids to keep your urine clear or pale yellow. SEEK MEDICAL CARE IF:  You received a tetanus shot and you have swelling, severe pain, redness, or bleeding at the injection site.  You have a fever.  A wound that was closed breaks open.  You notice a bad smell coming from the wound.  You notice something coming out of the wound, such as wood or glass.  Your pain is not controlled with medicine.  You have increased redness, swelling, or pain at the site of your wound.  You have fluid, blood, or pus coming from your wound.  You notice a change in the color of your skin near your wound.  You need to change the dressing frequently due to fluid, blood, or pus draining from the wound.  You develop a new rash.  You develop numbness around the wound. SEEK IMMEDIATE MEDICAL CARE IF:  You develop severe swelling around the injury site.  Your pain suddenly increases and is severe.  You develop painful lumps near the wound or on skin that is anywhere on your body.  You have a red streak going away from your wound.  The wound is on your hand or foot and you cannot properly move a finger or toe.  The wound is on your hand or foot and   you notice that your fingers or toes look pale or bluish.   This information is not intended to replace advice given to you by your health care provider. Make sure you discuss any questions you have with your health care provider.   Document Released: 01/18/2005 Document Revised: 04/27/2015 Document Reviewed: 07/23/2013 Elsevier Interactive Patient Education 2016 Elsevier Inc.  

## 2016-06-29 NOTE — Progress Notes (Signed)
Subjective:    Patient ID: Dawn Dunn, female    DOB: 11-26-1986, 30 y.o.   MRN: [id number] Chief Complaint  Patient presents with  . mole removal    above panty line    HPI Mole in pubic area getting larger and darker.  Gets caught on her underwear and so will get irritated and bleed at times.  Right knee has been painful. Didn't do anything to it but bothering her constantly.  On her feet all day at work which makes it worse. Hasn't really tried anything.  Past Medical History  Diagnosis Date  . Kidney stones   . Anemia   . Fibromyalgia   . Mild cervical dysplasia   . Asthma   . Allergy   . Depression    Past Surgical History  Procedure Laterality Date  . Right ankle fracture    . Wisdom tooth extraction    . Cystoscopy    . Cervical cyrosurgery     Current Outpatient Prescriptions on File Prior to Visit  Medication Sig Dispense Refill  . albuterol (PROVENTIL HFA;VENTOLIN HFA) 108 (90 BASE) MCG/ACT inhaler Inhale 1-2 puffs into the lungs every 6 (six) hours as needed for wheezing or shortness of breath. 18 g 3  . albuterol (PROVENTIL) (2.5 MG/3ML) 0.083% nebulizer solution Take 3 mLs (2.5 mg total) by nebulization every 6 (six) hours as needed for wheezing or shortness of breath. 150 mL 1  . carisoprodol (SOMA) 350 MG tablet Take 1 tablet (350 mg total) by mouth 4 (four) times daily as needed for muscle spasms. 60 tablet 3  . cholecalciferol (VITAMIN D) 1000 UNITS tablet Take 1,000 Units by mouth daily.    Marland Kitchen dicyclomine (BENTYL) 10 MG capsule Take 1 capsule (10 mg total) by mouth 4 (four) times daily -  before meals and at bedtime. As needed for abdominal cramping with diarrhea 60 capsule 0  . fluticasone (FLONASE) 50 MCG/ACT nasal spray Place 2 sprays into both nostrils daily. 16 g 6  . omeprazole (PRILOSEC) 40 MG capsule Take 1 capsule (40 mg total) by mouth daily. 90 capsule 1  . terbinafine (LAMISIL) 250 MG tablet Take 1 tablet (250 mg total) by mouth daily. 90  tablet 3  . Vitamin D, Ergocalciferol, (DRISDOL) 50000 units CAPS capsule Take 1 capsule (50,000 Units total) by mouth every 7 (seven) days. 24 capsule 0   No current facility-administered medications on file prior to visit.   Allergies  Allergen Reactions  . Hydrocodone-Acetaminophen Rash    Only allergic to [employer]; Hydrocodone tablets are ok  . Tussionex Pennkinetic Er [Hydrocod Polst-Cpm Polst Er] Itching   Family History  Problem Relation Age of Onset  . Diabetes Father   . Depression Father   . Hyperlipidemia Father    Social History   Social History  . Marital Status: Single    Spouse Name: N/A  . Number of Children: N/A  . Years of Education: N/A   Social History Main Topics  . Smoking status: Current Every Day Smoker -- 1.00 packs/day for 8 years    Last Attempt to Quit: 11/25/2012  . Smokeless tobacco: Never Used  . Alcohol Use: Yes     Comment: rarely  . Drug Use: No  . Sexual Activity: No   Other Topics Concern  . None   Social History Narrative   Patient is an Water quality scientist. She exercises walking 1-2 times weekly for 30 minutes.   Depression screen [facility] 2/9 06/29/2016 05/11/2016  Decreased  Interest 0 0  Down, Depressed, Hopeless 0 0  PHQ - 2 Score 0 0      Review of Systems  Constitutional: Negative for fever, chills, activity change, appetite change and unexpected weight change.  Cardiovascular: Negative for leg swelling.  Gastrointestinal: Negative for vomiting.  Musculoskeletal: Positive for myalgias, back pain, joint swelling, arthralgias and gait problem.  Skin: Positive for color change. Negative for rash and wound.  Allergic/Immunologic: Negative for immunocompromised state.  Neurological: Positive for weakness. Negative for numbness.  Hematological: Does not bruise/bleed easily.  Psychiatric/Behavioral: Positive for sleep disturbance. Negative for dysphoric mood. The patient is nervous/anxious.        Objective:  BP  115/76 mmHg  Pulse 96  Temp(Src) 98.3 F (36.8 C) (Oral)  Resp 16  Ht 5\' 4"  (1.626 m)  Wt 216 lb (97.977 kg)  BMI 37.06 kg/m2  LMP 06/21/2016  Physical Exam  Constitutional: She is oriented to person, place, and time. She appears well-developed and well-nourished. No distress.  HENT:  Head: Normocephalic and atraumatic.  Right Ear: External ear normal.  Eyes: Conjunctivae are normal. No scleral icterus.  Pulmonary/Chest: Effort normal.  Musculoskeletal:       Right knee: She exhibits swelling and effusion. She exhibits normal range of motion, no ecchymosis, no erythema, normal alignment, no LCL laxity, normal patellar mobility and no MCL laxity. Tenderness found. Patellar tendon tenderness noted.  Right prepatellar bursa feels mildly inflammed - slight effusion in it and patella sightly blottable so may have joint effusion as well. Ttp over patellar tendon distal to patella. Mild crepitus, pain with ROM. No sig laxity with anterior/posterior drawer or varus/valgus stress.  Neurological: She is alert and oriented to person, place, and time.  Skin: Skin is warm and dry. She is not diaphoretic. No erythema.     63mm rough raised dark brown lesion on mons pubis just left on midline, irritated/inflammed  Psychiatric: She has a normal mood and affect. Her behavior is normal.    Risks/benefits reviewed and verbal informed consent obtained.  Cleansed with EtOH x 3. Anesthetized with 2 cc of 2% lidocaine with epi subcutaneously. Cleansed with betadine x 2. Used dermablade to shave off 8 mm dm dark brown raised lesion on the left aspect of mons pubis. Hemostasis achieved by closing wound with 2 interrupted sutures with 5-0 prolene. Sutures left very long so will be easy to pick up amidst pubic hair. Pt tol procedure well. EBL 1 cc, No comp.      Assessment & Plan:   1. Nevus - shave biopsy today. RTC for suture removal in 1 wk   2. Prepatellar bursitis, right   3. Patellofemoral arthralgia of  right knee   Pt with long-standing chronic arthralgias - likely fibromyalgia - but seems to have a palpable bursitis though subtle. I wonder if she would benefit sig from a cortisone inj along with bracing and home exercises. Pt is not going to be able to afford regular PT but I do think a visit or two with Sports Med would get her diagnosed and treated in a more accurate and therefore long-term beneficial manner than I could provide.   Orders Placed This Encounter  Procedures  . Ambulatory referral to Sports Medicine    Referral Priority:  Routine    Referral Type:  Consultation    Number of Visits Requested:  1     Delman Cheadle, M.D.  [facility] [place], [place]  [id number] [phone] phone [phone] fax  07/06/2016 11:11 AM

## 2016-07-02 ENCOUNTER — Encounter: Payer: Self-pay | Admitting: Family Medicine

## 2016-07-06 ENCOUNTER — Ambulatory Visit (INDEPENDENT_AMBULATORY_CARE_PROVIDER_SITE_OTHER): Payer: BLUE CROSS/BLUE SHIELD | Admitting: Physician Assistant

## 2016-07-06 VITALS — BP 110/62 | HR 87 | Temp 98.3°F | Resp 16 | Ht 63.5 in | Wt 214.2 lb

## 2016-07-06 DIAGNOSIS — L0291 Cutaneous abscess, unspecified: Secondary | ICD-10-CM | POA: Diagnosis not present

## 2016-07-06 MED ORDER — SULFAMETHOXAZOLE-TRIMETHOPRIM 800-160 MG PO TABS: 1.0000 | ORAL_TABLET | Freq: Two times a day (BID) | ORAL | Status: AC

## 2016-07-06 NOTE — Patient Instructions (Addendum)
     IF you received an x-ray today, you will receive an invoice from [facility]. Please contact [facility] at [phone] with questions or concerns regarding your invoice.   IF you received labwork today, you will receive an invoice from [employer]. Please contact [facility] at [phone] with questions or concerns regarding your invoice.   Our billing staff will not be able to assist you with questions regarding bills from these companies.  You will be contacted with the lab results as soon as they are available. The fastest way to get your results is to activate your My Chart account. Instructions are located on the last page of this paperwork. If you have not heard from [place] regarding the results in 2 weeks, please contact this office.    Please use warm compresses in the area for at least 15 minutes, 4 times per day.  You can do warm soaks as well.  Take antibiotic as prescribed.  If your symptoms worsen, you need to return.

## 2016-07-13 NOTE — Telephone Encounter (Signed)
Patient would like for Dr. Brigitte Pulse to call her at [phone] in regard to the email below, stated she sent it a week ago and has not heard anything back. Can someone please address this thank you!

## 2016-07-18 ENCOUNTER — Encounter: Payer: Self-pay | Admitting: Family Medicine

## 2016-07-22 NOTE — Progress Notes (Signed)
[facility] [address], [place] [address] [phone]  Date:  07/06/2016   Name:  Dawn Dunn   DOB:  Nov 05, 1986   MRN:  [id number]  PCP:  Delman Cheadle, MD    History of Present Illness:  Dawn Dunn is a 30 y.o. female patient who presents to [facility] for suture removal and concern of vaginal swelling. Patient states that she has a mole in her pubic area that has gotten larger and darker. This was removed 7 days ago. Patient reports no swelling or drainage. Pain is very minimal. She has applied wound care as would was instructed at her visit. She is also concerned of vaginal swelling. It is at her left side. This has been for about a week. She has no fever or drainage from this area. No abnormal vaginal discharge. No hematuria, dysuria, or frequency. She has done nothing for relief.    Patient Active Problem List   Diagnosis Date Noted  . Arthralgia 11/28/2013  . Myalgia and myositis 11/28/2013  . Onychomycosis 11/28/2013  . GERD (gastroesophageal reflux disease) 11/28/2013  . Synovitis of hand 02/11/2013  . Fibromyalgia 01/17/2013  . Kidney stones 02/05/2012    Past Medical History:  Diagnosis Date  . Allergy   . Anemia   . Asthma   . Depression   . Fibromyalgia   . Kidney stones   . Mild cervical dysplasia     Past Surgical History:  Procedure Laterality Date  . cervical cyrosurgery    . CYSTOSCOPY    . right ankle fracture    . WISDOM TOOTH EXTRACTION      Social History  Substance Use Topics  . Smoking status: Current Every Day Smoker    Packs/day: 1.00    Years: 8.00    Last attempt to quit: 11/25/2012  . Smokeless tobacco: Never Used  . Alcohol use Yes     Comment: rarely    Family History  Problem Relation Age of Onset  . Diabetes Father   . Depression Father   . Hyperlipidemia Father     Allergies  Allergen Reactions  . Hydrocodone-Acetaminophen Rash    Only allergic to [employer]; Hydrocodone tablets are ok  .  Tussionex Pennkinetic Er [Hydrocod Polst-Cpm Polst Er] Itching    Medication list has been reviewed and updated.  Current Outpatient Prescriptions on File Prior to Visit  Medication Sig Dispense Refill  . albuterol (PROVENTIL HFA;VENTOLIN HFA) 108 (90 BASE) MCG/ACT inhaler Inhale 1-2 puffs into the lungs every 6 (six) hours as needed for wheezing or shortness of breath. 18 g 3  . albuterol (PROVENTIL) (2.5 MG/3ML) 0.083% nebulizer solution Take 3 mLs (2.5 mg total) by nebulization every 6 (six) hours as needed for wheezing or shortness of breath. 150 mL 1  . carisoprodol (SOMA) 350 MG tablet Take 1 tablet (350 mg total) by mouth 4 (four) times daily as needed for muscle spasms. 60 tablet 3  . dicyclomine (BENTYL) 10 MG capsule Take 1 capsule (10 mg total) by mouth 4 (four) times daily -  before meals and at bedtime. As needed for abdominal cramping with diarrhea 60 capsule 0  . fluticasone (FLONASE) 50 MCG/ACT nasal spray Place 2 sprays into both nostrils daily. 16 g 6  . omeprazole (PRILOSEC) 40 MG capsule Take 1 capsule (40 mg total) by mouth daily. 90 capsule 1  . terbinafine (LAMISIL) 250 MG tablet Take 1 tablet (250 mg total) by mouth daily. 90 tablet 3  .  Vitamin D, Ergocalciferol, (DRISDOL) 50000 units CAPS capsule Take 1 capsule (50,000 Units total) by mouth every 7 (seven) days. 24 capsule 0  . cholecalciferol (VITAMIN D) 1000 UNITS tablet Take 1,000 Units by mouth daily. Reported on 07/06/2016     No current facility-administered medications on file prior to visit.     ROS ROS otherwise unremarkable unless listed above.  Physical Examination: BP 110/62   Pulse 87   Temp 98.3 F (36.8 C) (Oral)   Resp 16   Ht 5' 3.5" (1.613 m)   Wt 214 lb 3.2 oz (97.2 kg)   LMP 06/21/2016   SpO2 98%   BMI 37.35 kg/m  Ideal Body Weight: Weight in (lb) to have BMI = 25: 143.1  Physical Exam  Constitutional: She is oriented to person, place, and time. She appears well-developed and  well-nourished. No distress.  HENT:  Head: Normocephalic and atraumatic.  Right Ear: External ear normal.  Left Ear: External ear normal.  Eyes: Conjunctivae and EOM are normal. Pupils are equal, round, and reactive to light.  Cardiovascular: Normal rate.   Pulmonary/Chest: Effort normal. No respiratory distress.  Genitourinary: Pelvic exam was performed with patient supine.  Genitourinary Comments: Left labia minora with swelling just above the introitus.  Mildly fluctuant.  No erythema or drainage at this time.  Slightly sensitive.    Neurological: She is alert and oriented to person, place, and time.  Skin: She is not diaphoretic.  Psychiatric: She has a normal mood and affect. Her behavior is normal.     Assessment and Plan: Dawn Dunn is a 30 y.o. female who is here today for suture removal and vaginal swelling. Advised warm compresses for the next 48 hours. If pain or swelling increased--advised to return to the clinic. Given abx. Suture removed without complication. 1. Abscess - sulfamethoxazole-trimethoprim (BACTRIM DS,SEPTRA DS) 800-160 MG tablet; Take 1 tablet by mouth 2 (two) times daily.  Dispense: 20 tablet; Refill: 0   Ivar Drape, PA-C [facility] 07/22/2016 8:33 AM

## 2016-07-26 ENCOUNTER — Encounter: Payer: Self-pay | Admitting: Family Medicine

## 2016-07-27 ENCOUNTER — Ambulatory Visit: Payer: BLUE CROSS/BLUE SHIELD | Admitting: Sports Medicine

## 2016-07-27 ENCOUNTER — Telehealth: Payer: Self-pay

## 2016-07-27 NOTE — Telephone Encounter (Signed)
Routed to medical records

## 2016-07-27 NOTE — Telephone Encounter (Signed)
Pt. Req. Historical rx to filled// PT states she had discussed this with Dr. Brigitte Pulse but as Dr. Brigitte Pulse is out until 08/07/16 she was curious as to if this could only be filled by Dr. Brigitte Pulse. Pt has sent several messages through MyChart but now knows Dr. Brigitte Pulse is out of office.  [phone] please call

## 2016-07-28 ENCOUNTER — Telehealth: Payer: Self-pay | Admitting: Emergency Medicine

## 2016-07-28 NOTE — Telephone Encounter (Signed)
Pt requesting medication RF Zoloft Please f/u

## 2016-07-30 MED ORDER — SERTRALINE HCL 50 MG PO TABS: 50.0000 mg | ORAL_TABLET | Freq: Every day | ORAL | 1 refills | Status: DC

## 2016-07-30 NOTE — Telephone Encounter (Signed)
Sent in [place] and sent [employer]

## 2016-08-03 ENCOUNTER — Ambulatory Visit: Payer: BLUE CROSS/BLUE SHIELD | Admitting: Sports Medicine

## 2016-09-23 ENCOUNTER — Encounter: Payer: Self-pay | Admitting: Family Medicine

## 2016-09-29 NOTE — Progress Notes (Signed)
Pt is checking on status of this email. Please advise at [phone]  [phone]

## 2016-10-03 ENCOUNTER — Other Ambulatory Visit: Payer: Self-pay | Admitting: Family Medicine

## 2016-10-03 NOTE — Telephone Encounter (Signed)
How many refills would like to give?  Per not looks like patient was looking to increase

## 2016-11-26 ENCOUNTER — Other Ambulatory Visit: Payer: Self-pay | Admitting: Family Medicine

## 2017-07-06 ENCOUNTER — Encounter: Payer: Self-pay | Admitting: Family Medicine

## 2017-07-06 ENCOUNTER — Other Ambulatory Visit: Payer: Self-pay | Admitting: Family Medicine

## 2017-07-06 NOTE — Telephone Encounter (Signed)
Pt is long-overdue for a f/u OV with FASTING labs. Please have her sched an appt within the next month so we can continue to make sure the medicines are keeping her healthy and safe before we refill them further. Thanks. 

## 2017-07-09 NOTE — Telephone Encounter (Signed)
mychart message sent to pt about making an appt for med refills

## 2017-08-09 ENCOUNTER — Other Ambulatory Visit: Payer: Self-pay | Admitting: Family Medicine

## 2017-08-13 NOTE — Telephone Encounter (Signed)
PHARMACY CALLING ABOUT REFILL ON SERTRALINE

## 2019-07-17 ENCOUNTER — Other Ambulatory Visit (HOSPITAL_COMMUNITY)
Admit: 2019-07-17 | Discharge: 2019-07-17 | Disposition: A | Discharge status: Home / Self Care | Payer: BC Managed Care – PPO | Source: Other Acute Inpatient Hospital

## 2019-07-17 LAB — SARS CORONAVIRUS 2 BY RT PCR (HOSPITAL ORDER, PERFORMED IN ~~LOC~~ HOSPITAL LAB): SARS Coronavirus 2: NEGATIVE
